# Patient Record
Sex: Female | Born: 1970 | Race: White | Hispanic: No | Marital: Married | Smoking: Current every day smoker
Health system: Southern US, Community
[De-identification: ages and names within clinical notes are randomized; demographics above are authoritative.]

## PROBLEM LIST (undated history)

## (undated) DIAGNOSIS — N809 Endometriosis, unspecified: Secondary | ICD-10-CM

## (undated) DIAGNOSIS — K7460 Unspecified cirrhosis of liver: Secondary | ICD-10-CM

## (undated) DIAGNOSIS — M1990 Unspecified osteoarthritis, unspecified site: Secondary | ICD-10-CM

## (undated) DIAGNOSIS — R52 Pain, unspecified: Secondary | ICD-10-CM

## (undated) DIAGNOSIS — O1490 Unspecified pre-eclampsia, unspecified trimester: Secondary | ICD-10-CM

## (undated) HISTORY — PX: TONSILLECTOMY: SUR1361

## (undated) HISTORY — PX: TUBAL LIGATION: SHX77

## (undated) HISTORY — PX: CHOLECYSTECTOMY: SHX55

---

## 2000-10-27 ENCOUNTER — Inpatient Hospital Stay (HOSPITAL_COMMUNITY): Admission: AD | Admit: 2000-10-27 | Discharge: 2000-10-27 | Payer: Self-pay | Admitting: *Deleted

## 2000-10-28 ENCOUNTER — Encounter: Payer: Self-pay | Admitting: *Deleted

## 2001-08-24 ENCOUNTER — Encounter: Payer: Self-pay | Admitting: Emergency Medicine

## 2001-08-24 ENCOUNTER — Emergency Department (HOSPITAL_COMMUNITY): Admission: EM | Admit: 2001-08-24 | Discharge: 2001-08-24 | Payer: Self-pay | Admitting: Emergency Medicine

## 2002-04-05 ENCOUNTER — Inpatient Hospital Stay (HOSPITAL_COMMUNITY): Admission: AD | Admit: 2002-04-05 | Discharge: 2002-04-05 | Payer: Self-pay | Admitting: *Deleted

## 2002-04-16 ENCOUNTER — Other Ambulatory Visit: Admission: RE | Admit: 2002-04-16 | Discharge: 2002-04-16 | Payer: Self-pay | Admitting: *Deleted

## 2002-06-02 ENCOUNTER — Inpatient Hospital Stay (HOSPITAL_COMMUNITY): Admission: AD | Admit: 2002-06-02 | Discharge: 2002-06-02 | Payer: Self-pay | Admitting: *Deleted

## 2002-09-18 ENCOUNTER — Inpatient Hospital Stay (HOSPITAL_COMMUNITY): Admission: AD | Admit: 2002-09-18 | Discharge: 2002-09-18 | Payer: Self-pay | Admitting: *Deleted

## 2002-10-21 ENCOUNTER — Inpatient Hospital Stay (HOSPITAL_COMMUNITY): Admission: AD | Admit: 2002-10-21 | Discharge: 2002-10-23 | Payer: Self-pay | Admitting: *Deleted

## 2002-10-25 ENCOUNTER — Inpatient Hospital Stay (HOSPITAL_COMMUNITY): Admission: AD | Admit: 2002-10-25 | Discharge: 2002-10-25 | Payer: Self-pay | Admitting: *Deleted

## 2002-12-07 ENCOUNTER — Inpatient Hospital Stay (HOSPITAL_COMMUNITY): Admission: AD | Admit: 2002-12-07 | Discharge: 2002-12-07 | Payer: Self-pay | Admitting: *Deleted

## 2003-04-27 ENCOUNTER — Emergency Department (HOSPITAL_COMMUNITY): Admission: EM | Admit: 2003-04-27 | Discharge: 2003-04-27 | Payer: Self-pay | Admitting: Emergency Medicine

## 2003-05-26 ENCOUNTER — Inpatient Hospital Stay (HOSPITAL_COMMUNITY): Admission: AD | Admit: 2003-05-26 | Discharge: 2003-05-27 | Payer: Self-pay | Admitting: *Deleted

## 2004-07-10 ENCOUNTER — Emergency Department (HOSPITAL_COMMUNITY): Admission: EM | Admit: 2004-07-10 | Discharge: 2004-07-10 | Payer: Self-pay | Admitting: Emergency Medicine

## 2006-08-25 ENCOUNTER — Emergency Department (HOSPITAL_COMMUNITY): Admission: EM | Admit: 2006-08-25 | Discharge: 2006-08-25 | Payer: Self-pay | Admitting: Emergency Medicine

## 2008-02-29 ENCOUNTER — Emergency Department (HOSPITAL_COMMUNITY): Admission: EM | Admit: 2008-02-29 | Discharge: 2008-02-29 | Payer: Self-pay | Admitting: Emergency Medicine

## 2008-09-09 ENCOUNTER — Inpatient Hospital Stay (HOSPITAL_COMMUNITY): Admission: AD | Admit: 2008-09-09 | Discharge: 2008-09-15 | Payer: Self-pay | Admitting: Obstetrics & Gynecology

## 2008-09-11 ENCOUNTER — Encounter (INDEPENDENT_AMBULATORY_CARE_PROVIDER_SITE_OTHER): Payer: Self-pay | Admitting: Obstetrics and Gynecology

## 2009-05-18 ENCOUNTER — Emergency Department (HOSPITAL_COMMUNITY): Admission: EM | Admit: 2009-05-18 | Discharge: 2009-05-18 | Payer: Self-pay | Admitting: Emergency Medicine

## 2009-05-21 ENCOUNTER — Encounter (HOSPITAL_COMMUNITY): Admission: RE | Admit: 2009-05-21 | Discharge: 2009-07-23 | Payer: Self-pay | Admitting: Emergency Medicine

## 2010-08-10 ENCOUNTER — Inpatient Hospital Stay (HOSPITAL_COMMUNITY): Admission: AD | Admit: 2010-08-10 | Discharge: 2010-08-10 | Payer: Self-pay | Admitting: Obstetrics & Gynecology

## 2010-08-10 ENCOUNTER — Ambulatory Visit: Payer: Self-pay | Admitting: Gynecology

## 2010-11-02 ENCOUNTER — Inpatient Hospital Stay (HOSPITAL_COMMUNITY)
Admission: AD | Admit: 2010-11-02 | Discharge: 2010-11-02 | Payer: Self-pay | Source: Home / Self Care | Attending: Obstetrics and Gynecology | Admitting: Obstetrics and Gynecology

## 2010-11-02 LAB — URINALYSIS, ROUTINE W REFLEX MICROSCOPIC
Bilirubin Urine: NEGATIVE
Hemoglobin, Urine: NEGATIVE
Ketones, ur: NEGATIVE mg/dL
Nitrite: NEGATIVE
Protein, ur: NEGATIVE mg/dL
Specific Gravity, Urine: <1.005 — ABNORMAL LOW (ref 1.005–1.030)
Urine Glucose, Fasting: NEGATIVE mg/dL
Urobilinogen, UA: 0.2 mg/dL (ref 0.0–1.0)
pH: 5.5 (ref 5.0–8.0)

## 2010-11-16 ENCOUNTER — Encounter
Admission: RE | Admit: 2010-11-16 | Discharge: 2010-11-29 | Payer: Self-pay | Source: Home / Self Care | Attending: Obstetrics and Gynecology | Admitting: Obstetrics and Gynecology

## 2011-01-02 ENCOUNTER — Ambulatory Visit: Payer: Self-pay | Admitting: Physical Therapy

## 2011-01-11 LAB — CBC
HCT: 43.7 % (ref 36.0–46.0)
Hemoglobin: 14.9 g/dL (ref 12.0–15.0)
MCH: 31.7 pg (ref 26.0–34.0)
MCHC: 34 g/dL (ref 30.0–36.0)
MCV: 93 fL (ref 78.0–100.0)
Platelets: 183 10*3/uL (ref 150–400)
RBC: 4.7 MIL/uL (ref 3.87–5.11)
RDW: 13.8 % (ref 11.5–15.5)
WBC: 10 10*3/uL (ref 4.0–10.5)

## 2011-01-11 LAB — URINE CULTURE
Colony Count: >=100000
Culture  Setup Time: 201110130039

## 2011-01-11 LAB — DIFFERENTIAL
Basophils Absolute: 0.1 10*3/uL (ref 0.0–0.1)
Basophils Relative: 1 % (ref 0–1)
Eosinophils Absolute: 0.2 10*3/uL (ref 0.0–0.7)
Eosinophils Relative: 2 % (ref 0–5)
Lymphocytes Relative: 22 % (ref 12–46)
Lymphs Abs: 2.2 10*3/uL (ref 0.7–4.0)
Monocytes Absolute: 0.4 10*3/uL (ref 0.1–1.0)
Monocytes Relative: 4 % (ref 3–12)
Neutro Abs: 7.2 10*3/uL (ref 1.7–7.7)
Neutrophils Relative %: 72 % (ref 43–77)

## 2011-01-11 LAB — URINALYSIS, ROUTINE W REFLEX MICROSCOPIC
Bilirubin Urine: NEGATIVE
Glucose, UA: NEGATIVE mg/dL
Ketones, ur: NEGATIVE mg/dL
Leukocytes, UA: NEGATIVE
Nitrite: POSITIVE — AB
Protein, ur: NEGATIVE mg/dL
Specific Gravity, Urine: <1.005 — ABNORMAL LOW (ref 1.005–1.030)
Urobilinogen, UA: 0.2 mg/dL (ref 0.0–1.0)
pH: 6 (ref 5.0–8.0)

## 2011-01-11 LAB — URINE MICROSCOPIC-ADD ON

## 2011-01-11 LAB — WET PREP, GENITAL
Trich, Wet Prep: NONE SEEN
Yeast Wet Prep HPF POC: NONE SEEN

## 2011-01-11 LAB — HCG, QUANTITATIVE, PREGNANCY: hCG, Beta Chain, Quant, S: 65282 m[IU]/mL — ABNORMAL HIGH (ref <5)

## 2011-01-11 LAB — POCT PREGNANCY, URINE: Preg Test, Ur: POSITIVE

## 2011-01-11 LAB — GC/CHLAMYDIA PROBE AMP, GENITAL
Chlamydia, DNA Probe: NEGATIVE
GC Probe Amp, Genital: NEGATIVE

## 2011-01-30 ENCOUNTER — Emergency Department (HOSPITAL_BASED_OUTPATIENT_CLINIC_OR_DEPARTMENT_OTHER)
Admission: EM | Admit: 2011-01-30 | Discharge: 2011-01-30 | Disposition: A | Discharge status: Home / Self Care | Payer: Medicaid Other | Attending: Emergency Medicine | Admitting: Emergency Medicine

## 2011-01-30 DIAGNOSIS — I10 Essential (primary) hypertension: Secondary | ICD-10-CM | POA: Insufficient documentation

## 2011-01-30 DIAGNOSIS — M549 Dorsalgia, unspecified: Secondary | ICD-10-CM | POA: Insufficient documentation

## 2011-03-02 ENCOUNTER — Inpatient Hospital Stay (HOSPITAL_COMMUNITY)
Admission: AD | Admit: 2011-03-02 | Discharge: 2011-03-02 | Disposition: A | Discharge status: Home / Self Care | Payer: Medicaid Other | Source: Ambulatory Visit | Attending: Obstetrics & Gynecology | Admitting: Obstetrics & Gynecology

## 2011-03-02 DIAGNOSIS — R109 Unspecified abdominal pain: Secondary | ICD-10-CM

## 2011-03-02 DIAGNOSIS — O4700 False labor before 37 completed weeks of gestation, unspecified trimester: Secondary | ICD-10-CM | POA: Insufficient documentation

## 2011-03-02 LAB — URINALYSIS, ROUTINE W REFLEX MICROSCOPIC
Bilirubin Urine: NEGATIVE
Glucose, UA: NEGATIVE mg/dL
Ketones, ur: NEGATIVE mg/dL
Leukocytes, UA: NEGATIVE
Nitrite: NEGATIVE
Protein, ur: NEGATIVE mg/dL
Specific Gravity, Urine: 1.01 (ref 1.005–1.030)
Urobilinogen, UA: 0.2 mg/dL (ref 0.0–1.0)
pH: 6.5 (ref 5.0–8.0)

## 2011-03-02 LAB — URINE MICROSCOPIC-ADD ON

## 2011-03-02 IMAGING — US US OB COMP LESS 14 WK
1 series · 14 of 23 positions shown · non-contrast
Comparison: None.

CLINICAL DATA: First trimester pregnancy.  Right flank pain.  Beta
HCG level [DATE].

OBSTETRIC <14 WK ULTRASOUND
TECHNIQUE: Transabdominal ultrasound was performed for evaluation
of the gestation as well as the maternal uterus and adnexal
regions.

[Series 1: us ob comp less 14 wks · 0.19mm/px · 23 acquisitions, 14 frames shown]
[im 1/23]
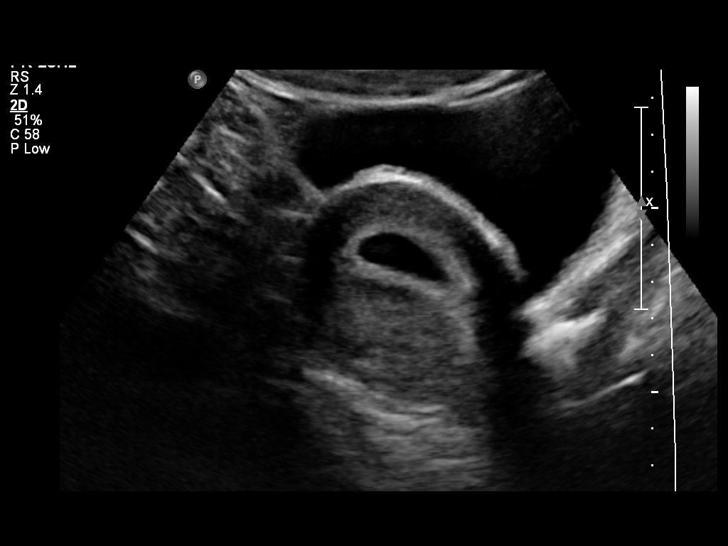
[im 3/23]
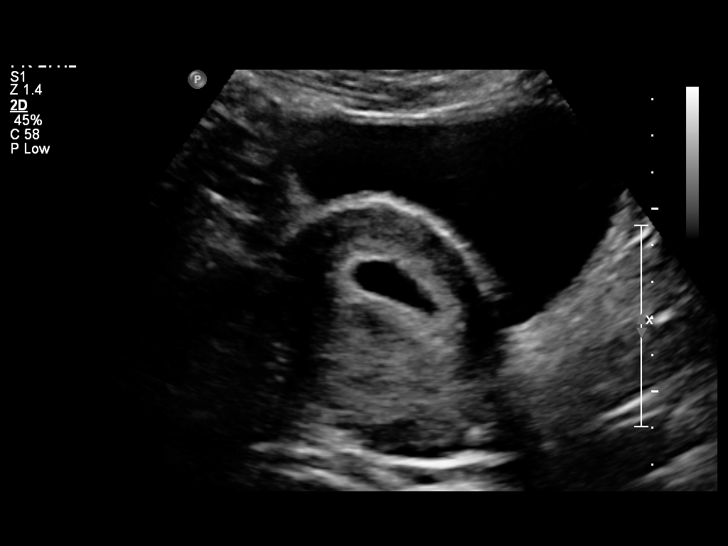
[im 5/23]
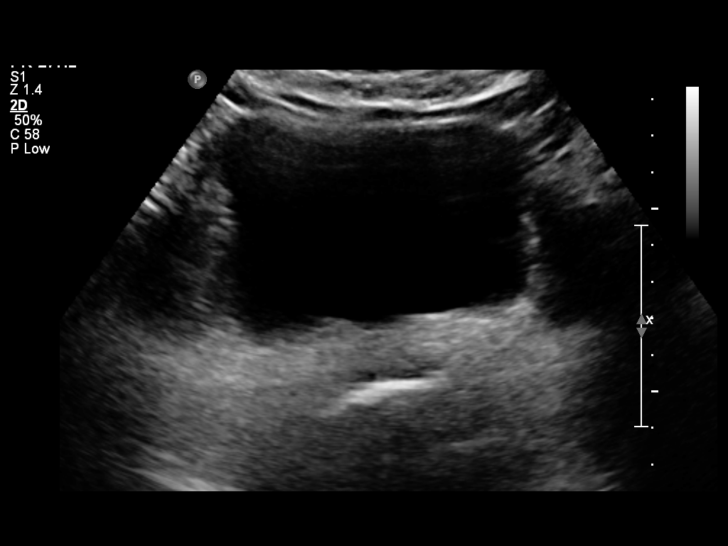
[im 6/23]
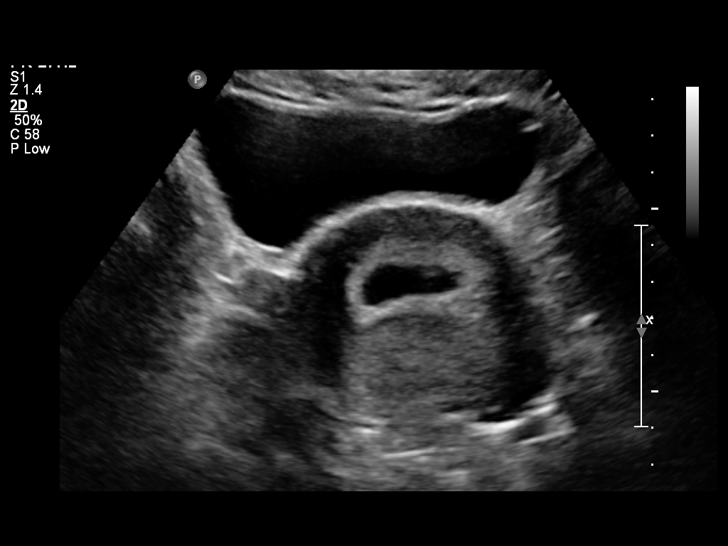
[im 8/23]
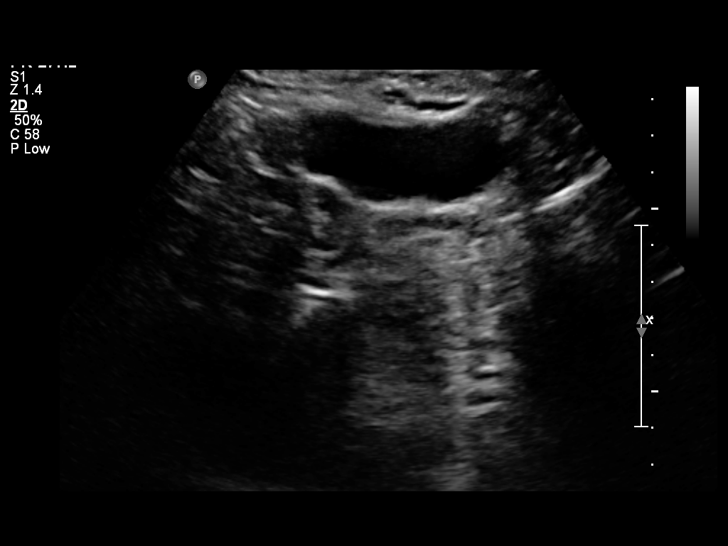
[im 10/23]
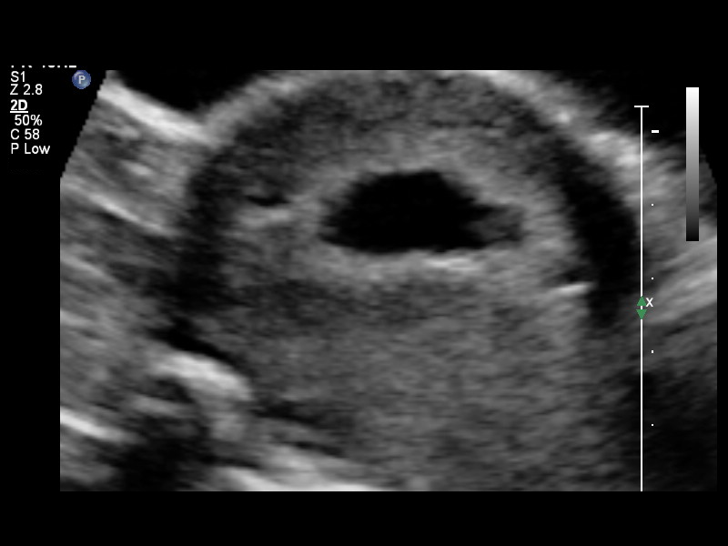
[im 11/23]
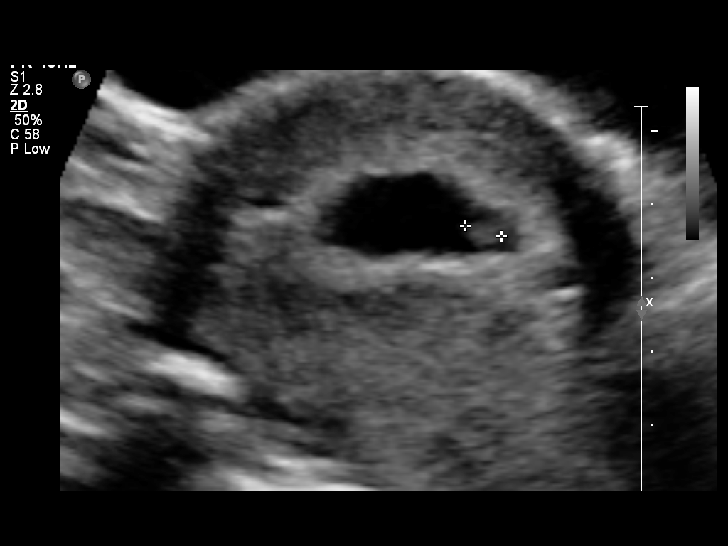
[im 13/23]
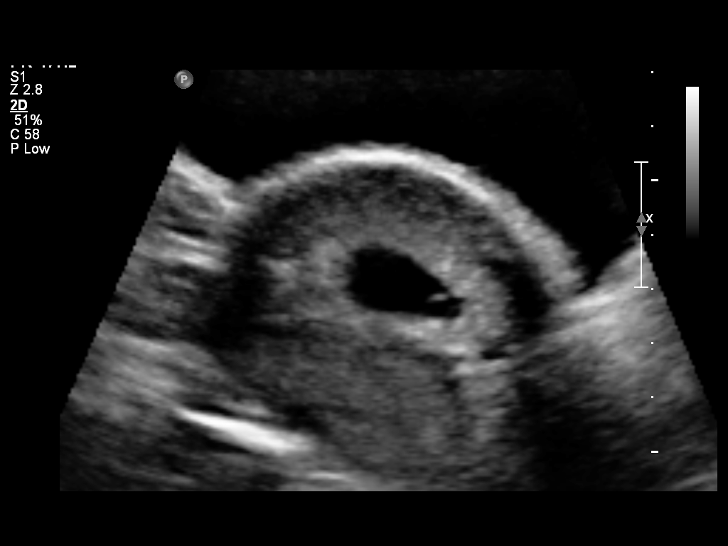
[im 14/23]
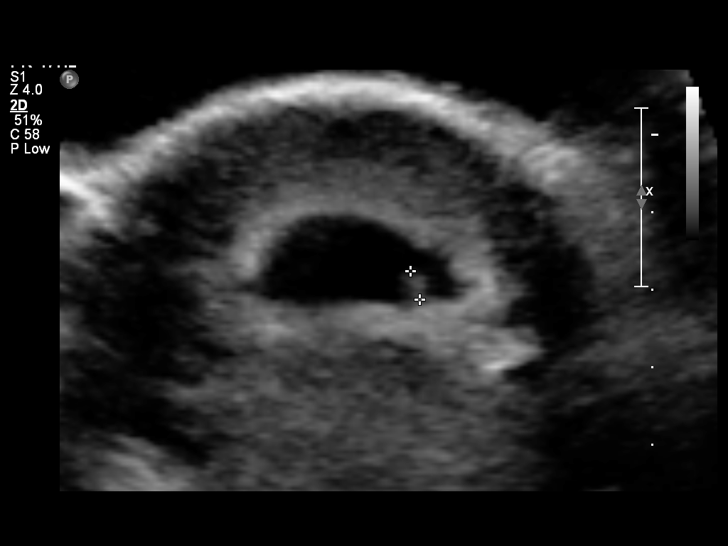
[im 16/23]
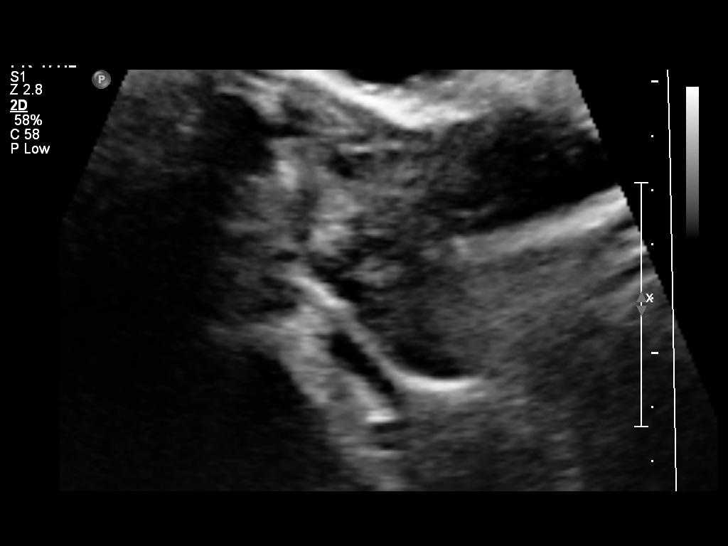
[im 18/23]
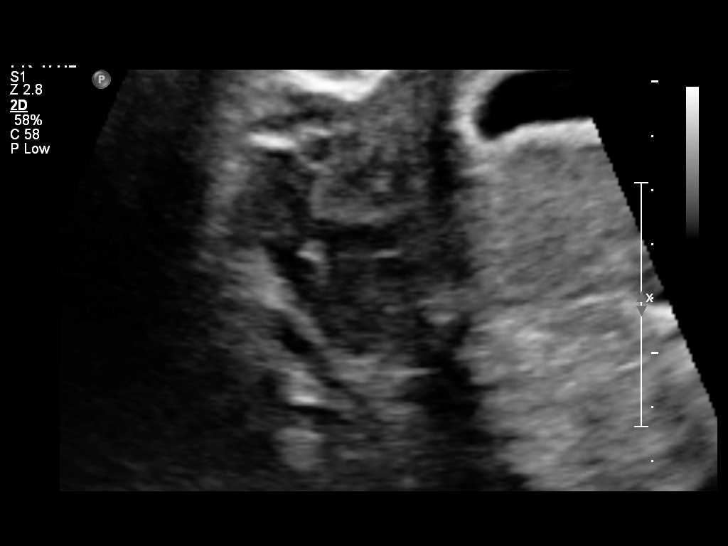
[im 19/23]
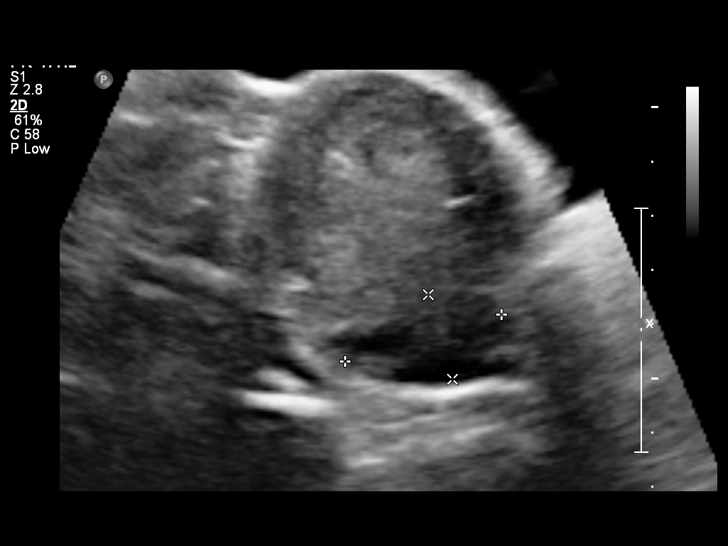
[im 21/23]
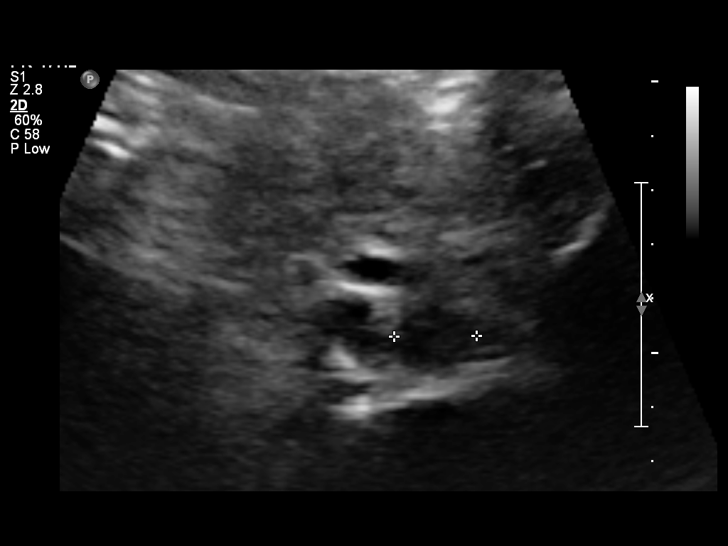
[im 23/23]
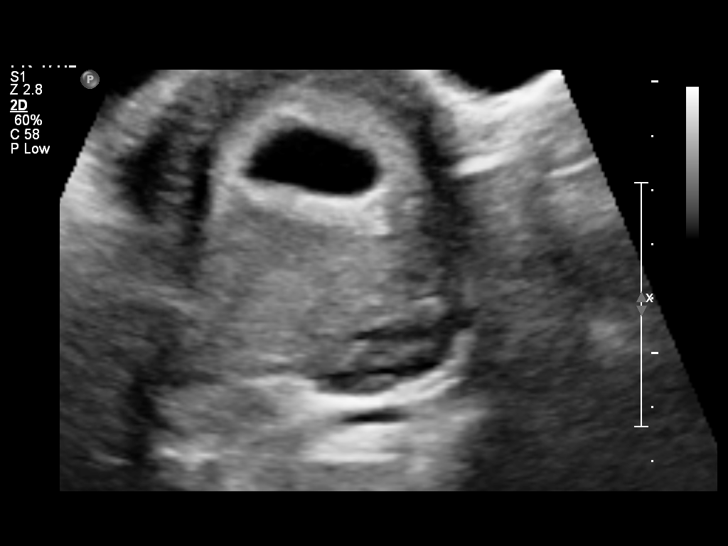

[14 of 23 positions shown; findings below may reference images not displayed]

Intrauterine gestational sac: Visualized and normal in shape.
Yolk sac: Visualized
Embryo: Visualized
Cardiac Activity: Visualized
Heart Rate: 132 bpm

CRL:  4.4 mm  6 weeks 1 day         US EDC: 04/04/2011

Maternal uterus/Adnexae:
No subchorionic hematoma is identified.  The maternal ovaries
appear normal.  There is no adnexal mass or free pelvic fluid.
IMPRESSION: 1.  Single live intrauterine gestation with best estimated
gestational age of 6 weeks 1 day.
2.  No demonstrated abnormalities.

## 2011-03-03 ENCOUNTER — Other Ambulatory Visit: Payer: Self-pay | Admitting: Obstetrics and Gynecology

## 2011-03-03 ENCOUNTER — Inpatient Hospital Stay (HOSPITAL_COMMUNITY)
Admission: AD | Admit: 2011-03-03 | Discharge: 2011-03-05 | DRG: 765 | Disposition: A | Discharge status: Home / Self Care | Payer: Medicaid Other | Source: Ambulatory Visit | Attending: Obstetrics and Gynecology | Admitting: Obstetrics and Gynecology

## 2011-03-03 DIAGNOSIS — O09529 Supervision of elderly multigravida, unspecified trimester: Secondary | ICD-10-CM | POA: Diagnosis present

## 2011-03-03 DIAGNOSIS — O34219 Maternal care for unspecified type scar from previous cesarean delivery: Secondary | ICD-10-CM | POA: Diagnosis present

## 2011-03-03 DIAGNOSIS — O4590 Premature separation of placenta, unspecified, unspecified trimester: Principal | ICD-10-CM | POA: Diagnosis present

## 2011-03-03 DIAGNOSIS — Z302 Encounter for sterilization: Secondary | ICD-10-CM

## 2011-03-03 LAB — DIFFERENTIAL
Basophils Absolute: 0 10*3/uL (ref 0.0–0.1)
Basophils Relative: 0 % (ref 0–1)
Eosinophils Absolute: 0.1 10*3/uL (ref 0.0–0.7)
Eosinophils Relative: 1 % (ref 0–5)
Lymphocytes Relative: 11 % — ABNORMAL LOW (ref 12–46)
Lymphs Abs: 2 10*3/uL (ref 0.7–4.0)
Monocytes Absolute: 0.7 10*3/uL (ref 0.1–1.0)
Monocytes Relative: 4 % (ref 3–12)
Neutro Abs: 14.9 10*3/uL — ABNORMAL HIGH (ref 1.7–7.7)
Neutrophils Relative %: 84 % — ABNORMAL HIGH (ref 43–77)

## 2011-03-03 LAB — TYPE AND SCREEN
ABO/RH(D): O POS
Antibody Screen: NEGATIVE

## 2011-03-03 LAB — CBC
HCT: 40.9 % (ref 36.0–46.0)
Hemoglobin: 14.1 g/dL (ref 12.0–15.0)
MCH: 30.9 pg (ref 26.0–34.0)
MCHC: 34.5 g/dL (ref 30.0–36.0)
MCV: 89.5 fL (ref 78.0–100.0)
Platelets: 156 10*3/uL (ref 150–400)
RBC: 4.57 MIL/uL (ref 3.87–5.11)
RDW: 14.3 % (ref 11.5–15.5)
WBC: 17.8 10*3/uL — ABNORMAL HIGH (ref 4.0–10.5)

## 2011-03-03 LAB — ABO/RH: ABO/RH(D): O POS

## 2011-03-04 LAB — CBC
HCT: 36.3 % (ref 36.0–46.0)
Hemoglobin: 12.2 g/dL (ref 12.0–15.0)
MCH: 30.3 pg (ref 26.0–34.0)
MCHC: 33.6 g/dL (ref 30.0–36.0)
MCV: 90.3 fL (ref 78.0–100.0)
Platelets: 122 10*3/uL — ABNORMAL LOW (ref 150–400)
RBC: 4.02 MIL/uL (ref 3.87–5.11)
RDW: 14.2 % (ref 11.5–15.5)
WBC: 14.5 10*3/uL — ABNORMAL HIGH (ref 4.0–10.5)

## 2011-03-04 LAB — RPR: RPR Ser Ql: NONREACTIVE

## 2011-03-14 NOTE — Op Note (Signed)
NAMESHUNDRA, WIRSING NO.:  [id number]   MEDICAL RECORD NO.:  [id number]          PATIENT TYPE:  INP   LOCATION:  9373                          FACILITY:  [facility]   PHYSICIAN:  Carrington Clamp, M.D. DATE OF BIRTH:  May 04, 1971   DATE OF PROCEDURE:  09/11/2008  DATE OF DISCHARGE:                               OPERATIVE REPORT   PREOPERATIVE DIAGNOSES:  1. Severe preeclampsia at 34 and 6/7th weeks.  2. Renal insufficiency.  3. Induction of labor secondary to above problems.  4. Nonreassuring fetal heart tones.  5. Severe variable D cells.   POSTOPERATIVE DIAGNOSIS:  1. Severe preeclampsia at 34 and 6/7th weeks.  2. Renal insufficiency.  3. Induction of labor secondary to above problems.  4. Nonreassuring fetal heart tones.  5. Severe variable D cells.   PROCEDURES:  Primary low-transverse cesarean section.   SURGEON:  Carrington Clamp, MD.   ASSISTANT:  None.   ANESTHESIA:  Epidural.   FINDINGS:  Female infant vertex presentation, Apgars 8 and 9.  Cord pH  was 7.29.  Weight was 4 pounds 15 ounces.  Normal tubes, ovaries, and  uterus was seen.   SPECIMENS:  Placenta.   DISPOSITION:  To Pathology.   ESTIMATED BLOOD LOSS:  500 mL.   IV FLUIDS:  600 mL.   URINE OUTPUT:  200 mL.   COMPLICATIONS:  None.   MEDICATIONS:  Clinda and Pitocin.   COUNTS:  Correct x3.   REASON FOR OPERATION:  Ms. Kaney was found this morning to have renal  insufficiency with a creatinine of 1.28, declining platelets from 150-  120, and blood pressures that were extremely labile at 160s over 90s-  100s.  Decision was made for induction of labor.  The patient was moved  to labor and delivery and Pitocin was started.  Membranes were ruptured  at about 2 o'clock.  Clear fluid was noted and the hand was noted to be  close to the cervix, so it was a true compound presentation.  Since then  the [facility] has remained stable.  She was rechecked in 2 hours and the head  was found at  that time to be well applied without a compound  presentation.  After a fetal scalpel, electrode was placed; however, the  patient had 2 severe variable D cells to the 40s for about a minute.  Each time the heart rate returned to baseline and after the second one  the heart rate was actually reactive without D cells.  The patient was  continued to labor and the Pitocin restarted.  At 5:24, I was called to  see the patient for second set of D cells now down to the 30s.  The  second D cell did not seem to be returning to baseline very quickly and  the labor and delivery team was instructed to move the patient to the  OR.  I met the patient and the staff in the OR.  The labor and delivery  nurse had indicated that she had checked for a prolapse cord and  compound hand and found neither.  The fetal  heart tones at this time  were returned to heart rate of 112 and after discussing with the patient  the risks, benefits, and alternatives, we went ahead and proceeded with  a C-section under spinal anesthesia.   TECHNIQUE:  After adequate spinal anesthesia was achieved, the patient  was prepped, draped in a sterile fashion in a dorsal supine position  with leftward tilt.  A Pfannenstiel skin incision was made with the  scalpel and carried down to fascia with the Bovie cautery.  The fascia  was incised in the midline and carried in transverse curvilinear manner  with the Mayo scissors.  The rectus muscles were split in the midline in  the bowel free portion.  The peritoneum was entered bluntly.  Peritoneum  was incised in superior and inferior manner with good visualization of  bowel and bladder.  The Alexis instrument was then placed and  vesicouterine fascia tented up and incised in a transverse curvilinear  manner.   The bladder flap was created with blunt dissection and retracted out of  the way and a 2-cm incision was made in the upper portion of lower  uterine segment until the amnion was  entered into.  The uterine incision  was stretched open manually in a transverse curvilinear manner and the  baby was identified in the vertex presentation delivered without  complication.  Baby was bulb suctioned, the cord was clamped and cut,  and baby was handed to awaiting neonatologist.  The cord bloods and  gases were obtained and the placenta was then delivered manually.  The  uterus was cleared of all debris and the uterine incision then closed  with a running lock stitch of 0-Monocryl.  An imbricating layer of 0-  Monocryl was performed as well.  Once hemostasis was achieved after  irrigation had been performed, the [employer] and all other  instruments were removed from the abdomen and the peritoneum was closed  with a running stitch of 2-0 Vicryl.  This was corporated to the rectus  muscles as a separate layer.  The fascia was closed with running stitch  of 0-Vicryl.  The subcutaneous tissue was rendered hemostatic with Bovie  cautery and irrigation.  This layer was then closed with interrupted  stitches of 2-0 plain gut.  The skin was closed with staples.  The baby  was vigorous and I believe was transferred to the newborn nursery and  weighed and found to be more than 2 kg.  The patient was transferred to  the recovery room in stable condition.  The patient had had a 24-hour  urine done earlier that day that had 104 mg of protein in it.      Carrington Clamp, M.D.  Electronically Signed     MH/MEDQ  D:  09/11/2008  T:  09/12/2008  Job:  [id number]

## 2011-03-15 NOTE — Op Note (Signed)
[name], Allen NO.:  [id number]  MEDICAL RECORD NO.:  [id number]           PATIENT TYPE:  I  LOCATION:  9143                          [facility]ACILITY:  [facility]  PHYSICIAN:  Carrington Clamp, M.D. DATE OF BIRTH:  Jan 10, 1971  DATE OF PROCEDURE:  03/03/2011 DATE OF DISCHARGE:                              OPERATIVE REPORT   PREOPERATIVE DIAGNOSES:  Probable abruption at 35 weeks in labor, previous cesarean section, and multiparous; desires permanent sterility.  POSTOPERATIVE DIAGNOSES:  Abruption at 35 weeks, previous cesarean section, and multiparous; desires permanent sterility.  PROCEDURE:  Low-transverse cesarean section with bilateral tubal ligation.  SURGEON:  Carrington Clamp, MD  ASSISTANTS:  None.  ANESTHESIA:  Spinal.  FINDINGS:  Female infant, vertex presentation with a nuchal cord. Apgars were 8 and 9.  Weight was 5 pounds 14 ounces.  There were normal tubes and ovaries seen.  There was a Couvelaire uterus (bruising on the fundus and right cornu of the uterus indicating an abruption).  SPECIMENS:  Right and left tubal segments and placenta to pathology.  ESTIMATED BLOOD LOSS:  600 mL.  IV FLUIDS:  1800 mL.  URINE OUTPUT:  200 mL.  COMPLICATIONS:  None.  COUNTS:  Correct x3.  REASON FOR OPERATION:  Ms. Dorothy Allen called me at 6 o'clock this evening stating that she had a large gush of blood going down her leg and was on the way to the hospital.  I agreed with that plan.  When she arrived at the hospital, the nurses observed yet again another gush of blood, although the fetal heart tones were reactive at that time.  I came in to see the patient after the nurse practitioner had done a sterile speculum exam for me revealing a moderate amount of bright red blood in the vault.  When I arrived, the patient's fetal heart tones were stable and reactive without decels, however, digital exam revealed cervix that was 1-250 and -2.  There was  bright red blood on the glove when it was withdrawn.  The patient was also contracting every 1-2 minutes and was in a lot of discomfort.  Previously, her cervix had been closed.  I discussed with the patient the risks, benefits, and alternatives of repeat C-section.  The patient had recently booked a repeat C-section and desired a repeat C-section in the event of labor. I discussed with the patient that I was afraid that there might either be an abruption or a uterine rupture because of the bleeding.  The patient agreed to proceed with a C-section at 35 weeks.  I queried the patient's several times about whether or not she was sure she wanted to have her tubes tied, she stated yes every time.  She has also indicated that she had signed the Medicaid papers on February 21, 2011.  She qualifies for emergency sterilization during the emergency preterm procedure.  TECHNIQUE:  After adequate spinal anesthesia was achieved, the patient was prepped and draped in usual sterile fashion in dorsal supine position with leftward tilt.  A Pfannenstiel skin incision was made with a scalpel and carried down to the fascia with  a Bovie cautery.  The fascia was incised in the midline and carried in transverse curvilinear manner with the Mayo scissors.  The fascia was reflected superiorly and inferiorly from the rectus muscles.  The rectus muscles split in the midline.  A bowel free portion of the peritoneum was then entered into with sharp and blunt dissection.  The peritoneum was then extended in superoinferior manner with good visualization of the bowel and the bladder.  The Alexis instrument was then placed inside the abdomen and set.  The vesicouterine fascia was tented up and incised in a transverse curvilinear manner with the Metzenbaum scissors.  The bladder flap was retracted out of the way and 2-cm incision was made and a 2-cm incision was made transversely in a very thin part of the upper portion  of the lower uterine segment.  Clear fluid was noted on entry into the uterus and amnion and the uterine incision was extended in transverse curvilinear manner.  The baby was identified in vertex presentation, delivered after double nuchal cord had been reduced.  The baby was handed to awaiting Pediatrics after cord had been clamped and cut.  Cord bloods had been obtained.  The placenta then delivered very easily through the incision without manual assistance.  The uterus was then cleared of all debris.  The uterine incision was closed with a running locked stitch of 0 Monocryl.  An imbricating layer of 0 Monocryl was performed as well.  Hemostasis was achieved.  The tubes were then brought up through the incision and each isthmic portion of mesosalpinx was entered into avascular portion with the Bovie cautery.  Two freehand stitches were placed on either side of the tube to be able to create an intervening segment of approximately 3 cm.  Each segment was then excised with Metzenbaum scissors.  Once hemostasis was achieved, the uterus was brought forward just a little bit and there was some bruising and bluish discoloration of the top of the uterus and some thrombosis of the vessels from the cornua and the right tube into the uterus which I believe is very consistent with an abruption.  The uterus was then replaced and irrigation was performed and the uterine incision reinspected and found to be hemostatic.  All instruments were then withdrawn from the abdomen.  The peritoneum was closed with a running stitch of 2-0 Vicryl.  This incorporated the rectus muscles as a separate layer.  The fascia was then closed with running stitch of 0 Vicryl.  The subcutaneous tissue was rendered hemostatic with Bovie cautery and irrigation.  This was then closed with interrupted stitches of plain gut.  The skin was closed with staples.  The patient tolerated the procedure well and was turned to recovery  room in stable condition.     Carrington Clamp, M.D.     MH/MEDQ  D:  03/03/2011  T:  03/04/2011  Job:  [id number]  Electronically Signed by Carrington Clamp MD on 03/15/2011 03:01:26 PM

## 2011-03-15 NOTE — Discharge Summary (Signed)
  Dorothy Allen, Dorothy Allen              ACCOUNT NO.:  [id number]  MEDICAL RECORD NO.:  [id number]           PATIENT TYPE:  I  LOCATION:  9143                          [facility]ACILITY[facility]  PHYSICIAN:  Carrington Clamp, M.D. DATE OF BIRTH:  1970/12/02  DATE OF ADMISSION:  03/03/2011 DATE OF DISCHARGE:  03/05/2011                              DISCHARGE SUMMARY   FINAL DIAGNOSES:  Intrauterine pregnancy at 24 weeks' gestation, abruption, history of previous cesarean section.  The patient desires permanent sterilization.  PROCEDURE:  Repeat low transverse cesarean section with bilateral tubal ligation.  SURGEON:  Dr. Carrington Clamp.  COMPLICATIONS:  None.  This 40 year old G4, P 2-0-1-2 presents at 81 weeks' gestation complaining of heavy bleeding with clots.  Dr. Henderson Cloud was called at 6 p.m.  Bleeding was moderate. She presented at the [facility].  She was having contractions and pain at this point.  The patient had a history of a cesarean section at 34 weeks and otherwise had an uncomplicated antepartum course.  Upon admission, fetal heart tones were in the 140s and reactive.  The patient was about 2 cm dilated and -2 station.  Because of this probable abruption at 35 weeks and history of cesarean section and also desires a permanent sterilization, decision was made to proceed with a cesarean section. The patient was taken to the operating room on Mar 04, 2011, by Dr. Carrington Clamp where a repeat low transverse cesarean section was performed with the delivery of a 5-pound 14-ounce female infant with Apgars of 8 and 9.  Delivery went without complications.  There was some bruising noticed on the fundus indicating an abruption.  Delivery went without complications.  The patient still expressed desire for permanent sterilization which was performed.  The patient's postoperative course was benign without any significant fevers.  The patient was felt ready for discharge on  postoperative day #2.  She was sent home on a regular diet, told to decrease activities, told to continue her prenatal vitamins, was given a prescription for Percocet 1-2 every 4-6 hours as needed for pain, was to follow up in our office in 4 weeks. Instructions and precautions were reviewed with the patient.  LABS ON DISCHARGE:  The patient had a hemoglobin of 12.2, white blood cell count of 14.5 and platelets of 122,000.    Leilani Able, P.A.-C.   ______________________________ Carrington Clamp, M.D.   MB/MEDQ  D:  03/07/2011  T:  03/07/2011  Job:  [id number]  Electronically Signed by Leilani Able P.A.-C. on 03/13/2011 08:50:38 AM Electronically Signed by Carrington Clamp MD on 03/15/2011 03:01:31 PM

## 2011-03-17 NOTE — Op Note (Signed)
   NAME:  Dorothy Allen, Dorothy Allen                         ACCOUNT NO.:  [id number]   MEDICAL RECORD NO.:  [id number]                   PATIENT TYPE:  INP   LOCATION:  9132                                 [facility]   PHYSICIAN:  Georgina Peer, M.D.              DATE OF BIRTH:  [birth year]   DATE OF PROCEDURE:  10/21/2002  DATE OF DISCHARGE:                                 OPERATIVE REPORT   INDICATIONS FOR OPERATIVE DELIVERY:  Maternal fatigue after pushing for two  hours.  Post delivery explanation was direct occiput posterior presentation.  Delivery by KIWI vacuum extraction.  First degree perineal laceration at  1731.   INDICATIONS:  A 40 year old gravida 2, para 0-0-1-0, [facility] November 01, 2001  presented in labor.  Had amniotomy at 7 a.m.  Progressed to complete  dilatation with augmentation at 1530.  Pushed well and at 1730 became  fatigued and had not had descent of the head past +2/+3 station.  There was  caput and molding, but apparently adequate pelvis.  Vacuum extraction was  offered including risks of bleeding, infection, cephalohematoma, scalp  lacerations.  These were accepted.  The KIWI vacuum device was applied and  within two contractions with good pushing effort and no pop-offs, a viable  female was delivered at 1731 direct occiput posterior.  There was a first  degree left mediolateral laceration which was repaired.  The placenta  spontaneously delivered at 1739.  Was repaired with chromic over 1%  Xylocaine anesthesia.  The infant had Apgars of 8 and 9.  The mother was  breast-feeding.  The infant went to the regular nursery.  The infant's name  was Dorothy Allen.  Anesthesia for delivery was epidural plus local 1% Xylocaine  for the perineal repair.                                               Georgina Peer, M.D.    JPN/MEDQ  D:  10/21/2002  T:  10/21/2002  Job:  [id number]

## 2011-03-17 NOTE — Discharge Summary (Signed)
NAMECHERAY, PARDI NO.:  [id number]   MEDICAL RECORD NO.:  [id number]          PATIENT TYPE:  INP   LOCATION:  9132                          [facility]   PHYSICIAN:  Carrington Clamp, M.D. DATE OF BIRTH:  Jun 25, 1971   DATE OF ADMISSION:  09/09/2008  DATE OF DISCHARGE:  09/15/2008                               DISCHARGE SUMMARY   FINAL DIAGNOSES:  1. Intrauterine pregnancy at 34-6/7th weeks' gestation.  2. Severe preeclampsia.  3. Renal insufficiency.  4. Induction of labor.  5. Nonreassuring fetal heart tone.  6. Severe variable decelerations.   PROCEDURE:  Primary low transverse cesarean section.   SURGEON:  Carrington Clamp, MD   COMPLICATIONS:  None.   This 40 year old, G3, P1-0-1-1, presents at 50-35 weeks' gestation with  elevated blood pressures.  She was seen in the office, sent to maternity  admission.  She had a BPP which was 6/8 and an estimated fetal weight in  about the 50th percentile and normal amniotic fluid index.  The  patient's antepartum course up to this point had been complicated by  tobacco dependence, which she was counseled on cessation during her  pregnancy.  The patient also had late prenatal care and was known to be  positive for group B strep and  was on extra iron supplement for her  anemia.  The patient was also known to be sensitive to latex.  The  patient's blood pressure did not start to elevate until this time.  Otherwise, she did not have any problems with this during her pregnancy.  She was admitted to the hospital.  Her liver function test at first  looked normal.  She did was showing signs of renal insufficiency with  low platelets and a decision was made to proceed with delivery at this  time.  The patient was induced, AROM was performed and IUPCs were  placed.  There was some nonreassuring fetal heart tones with some  decelerations down to the 30s for 2 minutes.  The patient was admitted  directly to the OR.   Fetal heart tones were turned back up to about 112.  The patient was taken for immediate cesarean section on September 11, 2008, by Dr. Carrington Clamp.  The patient had the delivery of a 4-  pound and 15-ounce female infant with Apgars of 8 and 9.  There was a  cord pH of 7.29.  Delivery went without complications.   POSTOPERATIVE COURSE:  The patient was continued to be on mag sulfate  and watched closely.  The patient was kept on mag sulfate until September 14, 2008.  She was stabilizing.  The patient did have a renal ultrasound  prior to this, which was normal.  By postoperative day #4, the patient  was felt ready for discharge.  She was sent home.  Her BUN and  creatinine had normalized.  She had been off mag sulfate.  Her blood  pressures were stable.  She was sent home with Percocet 1-2 every 4-6  hours as needed for pain, was given labetalol 200 mg to be taking twice  daily, her Protonix and her iron supplement, was to follow up in our  office in 1 week to recheck her blood pressure and of course to call for  any headaches, increased swelling, pain, or problems.   LABS ON DISCHARGE:  The patient had hemoglobin of 9.5, white blood cell  count of 8.2, platelets of 174,000, which were up from a low of 120,000.  The patient also had a BUN of 6 and a creatinine of 0.99 on the September 15, 2008 and uric acid levels which were returning to normal.      Leilani Able, P.A.-C.      Carrington Clamp, M.D.  Electronically Signed    MB/MEDQ  D:  10/12/2008  T:  10/13/2008  Job:  [id number]

## 2011-03-29 ENCOUNTER — Inpatient Hospital Stay (HOSPITAL_COMMUNITY): Admission: RE | Admit: 2011-03-29 | Payer: Self-pay | Source: Ambulatory Visit | Admitting: Obstetrics and Gynecology

## 2011-04-05 ENCOUNTER — Inpatient Hospital Stay (HOSPITAL_COMMUNITY): Admission: AD | Admit: 2011-04-05 | Payer: Self-pay | Source: Home / Self Care | Admitting: Obstetrics and Gynecology

## 2011-08-01 LAB — COMPREHENSIVE METABOLIC PANEL
ALT: 18
ALT: 22
ALT: 23
AST: 23
AST: 25
AST: 31
Albumin: 2 — ABNORMAL LOW
Albumin: 2.1 — ABNORMAL LOW
Albumin: 2.3 — ABNORMAL LOW
Albumin: 2.7 — ABNORMAL LOW
Alkaline Phosphatase: 101
Alkaline Phosphatase: 112
Alkaline Phosphatase: 131 — ABNORMAL HIGH
Alkaline Phosphatase: 89
BUN: 3 — ABNORMAL LOW
BUN: 3 — ABNORMAL LOW
BUN: 3 — ABNORMAL LOW
CO2: 26
CO2: 28
Calcium: 8.1 — ABNORMAL LOW
Chloride: 101
Chloride: 105
Chloride: 106
Creatinine, Ser: 1.25 — ABNORMAL HIGH
Creatinine, Ser: 1.28 — ABNORMAL HIGH
Creatinine, Ser: 1.3 — ABNORMAL HIGH
Creatinine, Ser: 1.3 — ABNORMAL HIGH
GFR calc Af Amer: 53 — ABNORMAL LOW
GFR calc Af Amer: 56 — ABNORMAL LOW
GFR calc Af Amer: >60
GFR calc non Af Amer: 49 — ABNORMAL LOW
GFR calc non Af Amer: 54 — ABNORMAL LOW
Glucose, Bld: 109 — ABNORMAL HIGH
Glucose, Bld: 84
Glucose, Bld: 92
Potassium: 3.1 — ABNORMAL LOW
Potassium: 3.4 — ABNORMAL LOW
Potassium: 3.6
Potassium: 3.7
Sodium: 137
Sodium: 139
Sodium: 140
Total Bilirubin: 0.3
Total Bilirubin: 0.4
Total Bilirubin: 0.4
Total Protein: 4.9 — ABNORMAL LOW
Total Protein: 5 — ABNORMAL LOW
Total Protein: 5.2 — ABNORMAL LOW
Total Protein: 6.2

## 2011-08-01 LAB — DIFFERENTIAL
Basophils Absolute: 0
Basophils Relative: 0
Basophils Relative: 0
Eosinophils Absolute: 0.1
Eosinophils Relative: 1
Eosinophils Relative: 1
Eosinophils Relative: 1
Eosinophils Relative: 1
Lymphocytes Relative: 14
Lymphocytes Relative: 15
Lymphs Abs: 1.3
Monocytes Absolute: 0.4
Monocytes Absolute: 0.4
Monocytes Absolute: 0.4
Monocytes Relative: 4
Monocytes Relative: 5
Neutro Abs: 7.2
Neutro Abs: 9.3 — ABNORMAL HIGH
Neutrophils Relative %: 79 — ABNORMAL HIGH

## 2011-08-01 LAB — RPR: RPR Ser Ql: NONREACTIVE

## 2011-08-01 LAB — URINALYSIS, ROUTINE W REFLEX MICROSCOPIC
Bilirubin Urine: NEGATIVE
Hgb urine dipstick: NEGATIVE
Ketones, ur: NEGATIVE
Specific Gravity, Urine: <1.005 — ABNORMAL LOW
pH: 7

## 2011-08-01 LAB — URIC ACID
Uric Acid, Serum: 8.2 — ABNORMAL HIGH
Uric Acid, Serum: 8.8 — ABNORMAL HIGH

## 2011-08-01 LAB — CBC
HCT: 30.5 — ABNORMAL LOW
HCT: 31.8 — ABNORMAL LOW
HCT: 38.7
Hemoglobin: 10.9 — ABNORMAL LOW
Hemoglobin: 11.3 — ABNORMAL LOW
Hemoglobin: 12.9
Hemoglobin: 9.5 — ABNORMAL LOW
MCHC: 33.2
MCHC: 34.8
MCHC: 34.9
MCV: 93.5
MCV: 93.8
MCV: 93.8
MCV: 94
Platelets: 120 — ABNORMAL LOW
Platelets: 134 — ABNORMAL LOW
Platelets: 177
RBC: 3.34 — ABNORMAL LOW
RBC: 3.4 — ABNORMAL LOW
RBC: 3.59 — ABNORMAL LOW
RBC: 3.63 — ABNORMAL LOW
RDW: 13.6
RDW: 13.7
RDW: 13.9
RDW: 14
WBC: 10.9 — ABNORMAL HIGH
WBC: 11.3 — ABNORMAL HIGH
WBC: 11.3 — ABNORMAL HIGH
WBC: 12.3 — ABNORMAL HIGH

## 2011-08-01 LAB — MAGNESIUM
Magnesium: 3.5 — ABNORMAL HIGH
Magnesium: 4.2 — ABNORMAL HIGH
Magnesium: 4.2 — ABNORMAL HIGH
Magnesium: 4.4 — ABNORMAL HIGH
Magnesium: 5.3 — ABNORMAL HIGH

## 2011-08-01 LAB — LACTATE DEHYDROGENASE
LDH: 135
LDH: 160

## 2011-08-01 LAB — CREATININE, SERUM
Creatinine, Ser: 1.35 — ABNORMAL HIGH
Creatinine, Ser: 1.36 — ABNORMAL HIGH
GFR calc Af Amer: 54 — ABNORMAL LOW
GFR calc non Af Amer: 44 — ABNORMAL LOW
GFR calc non Af Amer: 45 — ABNORMAL LOW

## 2011-08-01 LAB — PROTEIN, URINE, 24 HOUR
Protein, 24H Urine: 104 — ABNORMAL HIGH
Urine Total Volume-UPROT: 2600

## 2011-08-01 LAB — CREATININE CLEARANCE, URINE, 24 HOUR
Creatinine, 24H Ur: 866
Creatinine: 1.28 — ABNORMAL HIGH

## 2011-08-01 LAB — BUN
BUN: 5 — ABNORMAL LOW
BUN: 6

## 2011-08-01 LAB — CULTURE, BETA STREP (GROUP B ONLY)

## 2011-08-01 LAB — SODIUM, URINE, RANDOM: Sodium, Ur: 38

## 2011-08-01 LAB — URINE MICROSCOPIC-ADD ON

## 2011-08-23 ENCOUNTER — Ambulatory Visit (INDEPENDENT_AMBULATORY_CARE_PROVIDER_SITE_OTHER): Payer: Medicaid Other | Admitting: Family Medicine

## 2011-08-23 ENCOUNTER — Encounter: Payer: Self-pay | Admitting: Family Medicine

## 2011-08-23 DIAGNOSIS — M79605 Pain in left leg: Secondary | ICD-10-CM | POA: Insufficient documentation

## 2011-08-23 DIAGNOSIS — K219 Gastro-esophageal reflux disease without esophagitis: Secondary | ICD-10-CM

## 2011-08-23 DIAGNOSIS — M79604 Pain in right leg: Secondary | ICD-10-CM

## 2011-08-23 DIAGNOSIS — M545 Low back pain: Secondary | ICD-10-CM

## 2011-08-23 MED ORDER — OXYCODONE-ACETAMINOPHEN 7.5-325 MG PO TABS: 1.0000 | ORAL_TABLET | Freq: Two times a day (BID) | ORAL | Status: DC | PRN

## 2011-08-23 MED ORDER — CYCLOBENZAPRINE HCL 10 MG PO TABS: 10.0000 mg | ORAL_TABLET | Freq: Three times a day (TID) | ORAL | Status: DC | PRN

## 2011-08-23 NOTE — Patient Instructions (Signed)
Keep your appointment with me on the 11/2 Don't forget to do the gentle stretching exercises we talked about.
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
## 2011-08-24 NOTE — Assessment & Plan Note (Signed)
See back pain Acute on chronic low back pain.

## 2011-09-01 ENCOUNTER — Encounter: Payer: Self-pay | Admitting: Family Medicine

## 2011-09-01 ENCOUNTER — Ambulatory Visit (INDEPENDENT_AMBULATORY_CARE_PROVIDER_SITE_OTHER): Payer: Medicaid Other | Admitting: Family Medicine

## 2011-09-01 ENCOUNTER — Ambulatory Visit: Payer: Medicaid Other | Admitting: Family Medicine

## 2011-09-01 DIAGNOSIS — M545 Low back pain: Secondary | ICD-10-CM

## 2011-09-01 MED ORDER — NAPROXEN 500 MG PO TABS: 500.0000 mg | ORAL_TABLET | Freq: Two times a day (BID) | ORAL | Status: DC

## 2011-09-01 NOTE — Patient Instructions (Signed)
Make follow-up with Dr. Leveda Anna  Lumbosacral Strain Lumbosacral strain is one of the most common causes of back pain. There are many causes of back pain. Most are not serious conditions. CAUSES   Your backbone (spinal column) is made up of 24 main vertebral bodies, the sacrum, and the coccyx. These are held together by muscles and tough, fibrous tissue (ligaments). Nerve roots pass through the openings between the vertebrae. A sudden move or injury to the back may cause injury to, or pressure on, these nerves. This may result in localized back pain or pain movement (radiation) into the buttocks, down the leg, and into the foot. Sharp, shooting pain from the buttock down the back of the leg (sciatica) is frequently associated with a ruptured (herniated) disk. Pain may be caused by muscle spasm alone. Your caregiver can often find the cause of your pain by the details of your symptoms and an exam. In some cases, you may need tests (such as X-rays). Your caregiver will work with you to decide if any tests are needed based on your specific exam. HOME CARE INSTRUCTIONS    Avoid an underactive lifestyle. Active exercise, as directed by your caregiver, is your greatest weapon against back pain.     Avoid hard physical activities (tennis, racquetball, waterskiing) if you are not in proper physical condition for it. This may aggravate or create problems.     If you have a back problem, avoid sports requiring sudden body movements. Swimming and walking are generally safer activities.     Maintain good posture.     Avoid becoming overweight (obese).     Use bed rest for only the most extreme, sudden (acute) episode. Your caregiver will help you determine how much bed rest is necessary.     For acute conditions, you may put ice on the injured area.     Put ice in a plastic bag.     Place a towel between your skin and the bag.     Leave the ice on for 15 to 20 minutes at a time, every 2 hours, or as  needed.     After you are improved and more active, it may help to apply heat for 30 minutes before activities.  See your caregiver if you are having pain that lasts longer than expected. Your caregiver can advise appropriate exercises or therapy if needed. With conditioning, most back problems can be avoided. SEEK IMMEDIATE MEDICAL CARE IF:    You have numbness, tingling, weakness, or problems with the use of your arms or legs.     You experience severe back pain not relieved with medicines.     There is a change in bowel or bladder control.     You have increasing pain in any area of the body, including your belly (abdomen).     You notice shortness of breath, dizziness, or feel faint.     You feel sick to your stomach (nauseous), are throwing up (vomiting), or become sweaty.     You notice discoloration of your toes or legs, or your feet get very cold.     Your back pain is getting worse.     You have a fever.  MAKE SURE YOU:    Understand these instructions.     Will watch your condition.     Will get help right away if you are not doing well or get worse.  Document Released: 07/26/2005 Document Revised: 06/28/2011 Document Reviewed: 01/15/2009 Exit[employer] Patient Information  [address] [place].
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
## 2011-09-15 ENCOUNTER — Ambulatory Visit: Payer: Medicaid Other | Admitting: Family Medicine

## 2011-09-20 ENCOUNTER — Encounter: Payer: Self-pay | Admitting: Family Medicine

## 2011-09-20 ENCOUNTER — Ambulatory Visit (INDEPENDENT_AMBULATORY_CARE_PROVIDER_SITE_OTHER): Payer: Medicaid Other | Admitting: Family Medicine

## 2011-09-20 DIAGNOSIS — K219 Gastro-esophageal reflux disease without esophagitis: Secondary | ICD-10-CM

## 2011-09-20 DIAGNOSIS — M545 Low back pain: Secondary | ICD-10-CM

## 2011-09-20 MED ORDER — OXYCODONE-ACETAMINOPHEN 7.5-325 MG PO TABS: 1.0000 | ORAL_TABLET | Freq: Two times a day (BID) | ORAL | Status: DC | PRN

## 2011-09-20 MED ORDER — OMEPRAZOLE 40 MG PO CPDR: 40.0000 mg | DELAYED_RELEASE_CAPSULE | Freq: Two times a day (BID) | ORAL | Status: AC

## 2011-09-20 MED ORDER — AMITRIPTYLINE HCL 25 MG PO TABS: 25.0000 mg | ORAL_TABLET | Freq: Three times a day (TID) | ORAL | Status: AC
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
## 2011-09-20 NOTE — Patient Instructions (Signed)
Congrats on wt loss. Congrats on decreasing smoking.  I hope you have stopped when I next see you. See me in one month. I switched from Nexium to omeprazole for your heartburn.  Start by taking it twice a day. If you heartburn comes under control, you can cut back to once a day.   I stopped you Naprosyn which should also help your heartburn.   I started a new drug, amitriptyline.  It should help your panic attacks and your nerve pain.  It may make you very sleepy at first.  You can start by taking it only once or twice a day.  As you get used to it, build up to three times a day.   I refilled your percocet at 60.  Next month, we will start cutting back.

## 2011-09-25 NOTE — Assessment & Plan Note (Signed)
Switched to high dose generic omeprozole due to nexium "not working" and cost.

## 2011-09-25 NOTE — Assessment & Plan Note (Signed)
She is asking for more pain meds, my long term plan is less narcotics.  Will start amitriptyline for neuropathic pain.

## 2011-10-18 ENCOUNTER — Ambulatory Visit: Payer: Medicaid Other | Admitting: Family Medicine

## 2011-10-20 ENCOUNTER — Ambulatory Visit (INDEPENDENT_AMBULATORY_CARE_PROVIDER_SITE_OTHER): Payer: Medicaid Other | Admitting: Family Medicine

## 2011-10-20 ENCOUNTER — Encounter: Payer: Self-pay | Admitting: Family Medicine

## 2011-10-20 VITALS — BP 125/85 | HR 94 | Ht 64.0 in | Wt 173.0 lb

## 2011-10-20 DIAGNOSIS — M545 Low back pain: Secondary | ICD-10-CM

## 2011-10-20 DIAGNOSIS — Z23 Encounter for immunization: Secondary | ICD-10-CM

## 2011-10-20 MED ORDER — OXYCODONE-ACETAMINOPHEN 7.5-325 MG PO TABS: 1.0000 | ORAL_TABLET | Freq: Two times a day (BID) | ORAL | Status: DC | PRN

## 2011-10-20 MED ORDER — OXYCODONE-ACETAMINOPHEN 7.5-325 MG PO TABS: 1.0000 | ORAL_TABLET | ORAL | Status: DC | PRN

## 2011-10-20 MED ORDER — CYCLOBENZAPRINE HCL 10 MG PO TABS: 10.0000 mg | ORAL_TABLET | Freq: Three times a day (TID) | ORAL | Status: AC | PRN

## 2011-10-20 NOTE — Patient Instructions (Signed)
You must keep track or your prescriptions. See me in three months. Continue to be active to strengthen your back.

## 2011-10-20 NOTE — Assessment & Plan Note (Signed)
Stable on current med regimine.  Will continue.  Will discuss weaning narcotics when infant no longer needs to be carried everywhere.

## 2011-10-20 NOTE — Progress Notes (Signed)
  Subjective:    Patient ID: Dorothy Allen, female    DOB: [birth year], 40 y.o.   MRN: [id number]  HPI Back pain continues.  Was without pain meds for three days and was "terrible."  While my long term plan is to wean, not this visit.  Needs flu shot.  Otherwise up to date on Health maint.    Stress level is high.  Husband disabled in pain clinic.  New baby and Christmas.  No major depression.      Review of Systems     Objective:   Physical Exam Affect good.  Able to lift and carry her 39 month old without too much discomfort.       Assessment & Plan:

## 2011-12-19 ENCOUNTER — Encounter: Payer: Self-pay | Admitting: Family Medicine

## 2011-12-19 ENCOUNTER — Telehealth: Payer: Self-pay | Admitting: *Deleted

## 2011-12-19 MED ORDER — OXYCODONE-ACETAMINOPHEN 5-325 MG PO TABS: 1.0000 | ORAL_TABLET | Freq: Three times a day (TID) | ORAL | Status: AC | PRN

## 2011-12-19 NOTE — Telephone Encounter (Signed)
Patient came in and exchanged prescriptions.  Dorothy Allen

## 2011-12-19 NOTE — Telephone Encounter (Signed)
Pharmacist calling to let Dr. Leveda Anna know that Percocet 7.5/325 is not commercially available.  Patient will need to come by and pick up new RX for either 5/325, 10/325 or Tylox 5/500.  Spoke with Dr. Leveda Anna.  Ok to change to 5/325.  Will forward to him to write new Rx for patient to pick up.  Ileana Ladd

## 2011-12-19 NOTE — Telephone Encounter (Signed)
Error

## 2011-12-19 NOTE — Telephone Encounter (Signed)
This encounter was created in error - please disregard.

## 2011-12-19 NOTE — Telephone Encounter (Signed)
Changed from 7.5/325 to 5/325.  Increased monthly supply from 60 to 85.

## 2011-12-22 ENCOUNTER — Ambulatory Visit: Payer: Medicaid Other | Admitting: Family Medicine

## 2011-12-27 ENCOUNTER — Ambulatory Visit: Payer: Medicaid Other | Admitting: Family Medicine

## 2012-01-02 ENCOUNTER — Encounter: Payer: Self-pay | Admitting: Family Medicine

## 2012-01-12 ENCOUNTER — Ambulatory Visit (INDEPENDENT_AMBULATORY_CARE_PROVIDER_SITE_OTHER): Payer: Medicaid Other | Admitting: Family Medicine

## 2012-01-12 ENCOUNTER — Encounter: Payer: Self-pay | Admitting: Family Medicine

## 2012-01-12 ENCOUNTER — Other Ambulatory Visit: Payer: Medicaid Other

## 2012-01-12 VITALS — BP 123/95 | HR 98 | Temp 97.8°F | Ht 64.0 in | Wt 175.0 lb

## 2012-01-12 DIAGNOSIS — M545 Low back pain: Secondary | ICD-10-CM

## 2012-01-12 DIAGNOSIS — K219 Gastro-esophageal reflux disease without esophagitis: Secondary | ICD-10-CM

## 2012-01-12 MED ORDER — OXYCODONE-ACETAMINOPHEN 5-325 MG PO TABS: 1.0000 | ORAL_TABLET | Freq: Three times a day (TID) | ORAL | Status: AC | PRN

## 2012-01-12 MED ORDER — FAMOTIDINE 40 MG PO TABS: 40.0000 mg | ORAL_TABLET | Freq: Every day | ORAL | Status: AC

## 2012-01-12 NOTE — Progress Notes (Signed)
  Subjective:    Patient ID: Dorothy Allen, female    DOB: [birth year], 41 y.o.   MRN: [id number]  HPI Dorothy Allen is a 41 yo F with PMHx of low back pain and GERD who presents for med refill and evaluation of hematemesis.  Pt reports that she experiences episodes during which she wakes up with acidic taste in her mouth, has "rotten egg burps," and vomits 2-3 times.  She says that these episodes occur about every 2 weeks and only last for one day.  She reports that she had similar episodes a few years ago, but her current bout started about 3 mos ago.  She cannot make any associations with foods, lifestyle, or situations.  She does report that she was switched from prilosec to nexium about 3 mos ago as her Medicaid did not cover prilosec.  She says that nothing seems to make the episodes better.  Denies fever, diarrhea, lightheadedness, SOB, weight loss.  She reports that lying down makes her reflux worse as do tomato-based foods.  Pt is also concerned about her current medication for anxiety and panic attacks.  She says that the amitriptyline makes her drowsy during the day.  She reports that she still has high anxiety some days.  Pt reports that she has taken celexa, paxil, and diazepam in the past for anxiety.  Celexa caused weight gain and paxil was not effective for symptoms.   Review of Systems  All other systems reviewed and are negative.      Objective:   Physical Exam  Constitutional: She appears well-developed and well-nourished.  HENT:  Mouth/Throat: No oral lesions. No dental abscesses or uvula swelling. No oropharyngeal exudate or posterior oropharyngeal erythema.       Patient is s/p tonsillectomy.   Neck: Normal range of motion. Neck supple.  Cardiovascular: Normal rate, regular rhythm and normal heart sounds.  Exam reveals no gallop and no friction rub.   No murmur heard. Pulmonary/Chest: Effort normal and breath sounds normal. No respiratory distress. She has no wheezes. She  has no rales. She exhibits no tenderness.  Lymphadenopathy:    She has no cervical adenopathy.  Skin: Skin is warm and dry.   Blood pressure 123/95, pulse 98, temperature 97.8 F (36.6 C), temperature source Oral, height 5\' 4"  (1.626 m), weight 175 lb (79.379 kg).      Assessment & Plan:

## 2012-01-12 NOTE — Patient Instructions (Signed)
Thank you for visiting [place] today. We started you on another medication for your reflux.  Take this once a day.  Continue taking omeprazole twice each day. Please schedule an abdominal ultrasound to evaluate your symptoms. We will see you back in 2-3 weeks to review all of the test results.

## 2012-01-15 NOTE — Assessment & Plan Note (Signed)
Stable on current meds.  No new symptoms and no significant improvement.

## 2012-01-15 NOTE — Assessment & Plan Note (Signed)
The symptoms do sound like reflux - but I am concerned that she has not responded to regular PPI.  Reflux may be severe and need higher dose (and consideration for laporoscopic fundoplication.)  Still, will broaden the differential and check abd ultrasound.

## 2012-01-18 ENCOUNTER — Telehealth: Payer: Self-pay | Admitting: *Deleted

## 2012-01-18 NOTE — Telephone Encounter (Signed)
Abdominal Ultrasound scheduled at [facility] ([address])  for 01/19/12 @ 8:45am.  Patient should be NPO after midnight.  I have called Ms. Erck numerous times this week to give her this information and have not gotten an answer.  Unable to leave message so I have called [facility] and cancelled this appointment.  If patient calls back we can reschedule this appointment.  Ileana Ladd

## 2012-01-19 ENCOUNTER — Other Ambulatory Visit: Payer: Medicaid Other

## 2012-01-31 ENCOUNTER — Ambulatory Visit: Payer: Medicaid Other | Admitting: Family Medicine

## 2012-01-31 ENCOUNTER — Other Ambulatory Visit (INDEPENDENT_AMBULATORY_CARE_PROVIDER_SITE_OTHER): Payer: Self-pay

## 2012-01-31 DIAGNOSIS — M545 Low back pain, unspecified: Secondary | ICD-10-CM

## 2012-01-31 DIAGNOSIS — K219 Gastro-esophageal reflux disease without esophagitis: Secondary | ICD-10-CM

## 2012-01-31 LAB — CBC
HCT: 50.5 % — ABNORMAL HIGH (ref 36.0–46.0)
MCHC: 32.1 g/dL (ref 30.0–36.0)
MCV: 91.7 fL (ref 78.0–100.0)
RDW: 13.9 % (ref 11.5–15.5)

## 2012-01-31 NOTE — Progress Notes (Signed)
Cbc and hpylori done today [facility] Dorothy Allen

## 2012-02-01 ENCOUNTER — Telehealth: Payer: Self-pay | Admitting: *Deleted

## 2012-02-01 NOTE — Progress Notes (Signed)
  Subjective:    Patient ID: Dorothy Allen, female    DOB: [birth year], 41 y.o.   MRN: [id number]  HPI called and given results.  Patient states she has not yet had ultrasound scheduled.  Will forward to nurse to arrange.    Review of Systems     Objective:   Physical Exam        Assessment & Plan:

## 2012-02-01 NOTE — Telephone Encounter (Signed)
Spoke with patient about setting up [place] ordered and she will contact me when her insurance goes through so [place] can be set up

## 2012-02-13 ENCOUNTER — Telehealth: Payer: Self-pay | Admitting: Family Medicine

## 2012-02-13 NOTE — Telephone Encounter (Signed)
This pt is a SDA only pt since 3.15.2013. Pt stated that she had spoken to you (Dr. Leveda Anna) concerning her insurance problem that she is having and that you (Dr. Leveda Anna) informed her that you understood the issues that she was having and that anytime she needed a refill that you (Dr. Leveda Anna) would put the script up front for her to pick up. also pt was to have had pelvic US done that was scheduled for her but due to the pt not being able to be reached this was cancelled and pt was to reschedule and has not done this as of this date spoke with pt and she stated that her medicaid is no longer in effect and is in the process of getting it reinstated.  I explained to pt that due to her status of being an SDA pt that she will have to come in to be seen in order to get her medication refilled.   Looking back at a previous OV note it was noted that Dr. Leveda Anna stated:    Able to lift and carry her 84 month old without too much discomfort.   Low back pain radiating to both legs - Sanjuana Letters, MD 10/20/2011 4:07 PM Signed  Stable on current med regimine. Will continue. Will discuss weaning narcotics when infant no longer needs to be carried everywhere.  Laureen Ochs, Viann Shove

## 2012-02-13 NOTE — Telephone Encounter (Signed)
Requesting refill rx for oxycodone to left up front for pickup.

## 2012-02-16 MED ORDER — HYDROCODONE-ACETAMINOPHEN 5-500 MG PO TABS: 1.0000 | ORAL_TABLET | Freq: Three times a day (TID) | ORAL | Status: AC | PRN

## 2012-02-16 NOTE — Telephone Encounter (Signed)
I appreciate Anitra Lauth handling of this while I am out of the office.  Good teamwork.

## 2012-02-16 NOTE — Telephone Encounter (Signed)
Called and pt was not home spoke with her husband Viviann Spare and I informed him that Dr. Leveda Anna will give her an Rx for Vicodin 5-500 for her pain and that she will need to follow up with him if this is not sufficient for her. She will only receive #90 tablets with no refills on this medication. I asked him what pharmacy he stated [facility]. I told him that I would be calling this in for her and to give them about an 1 hour before going to pick it up. He stated that he would inform her of this.Heath Gold   Forwarded to Dr. Leveda Anna for review.Loralee Pacas [place]

## 2012-02-16 NOTE — Telephone Encounter (Signed)
As is expected, I have a different recollection of the conversation than the patient.  I am out of the office today.  Nurses please call patient and give her this message: She has an obligation to do her part.  That includes: 1. Keeping appointments at our office and with Xray and others. 2. Keeping her Medicaid and [employer] certification in effect.  In return, I will continue to work with her in controlling her pain while at the same time weaning her from narcotics.  She has been on high dose oxycodone, which was weaned from 7.5 to 5 last visit.  As an act of good faith I am willing to call in hydrocodone/APAP 5/500, #90 One po tid prn pain.  No refills.  If this is not sufficient for her, she will need to wait for an appointment to see me.  If it is OK, please call in this Rx for me and document in chart.

## 2012-03-11 ENCOUNTER — Other Ambulatory Visit: Payer: Self-pay | Admitting: Family Medicine

## 2012-03-11 DIAGNOSIS — M545 Low back pain: Secondary | ICD-10-CM

## 2012-03-11 MED ORDER — HYDROCODONE-ACETAMINOPHEN 5-500 MG PO CAPS: 1.0000 | ORAL_CAPSULE | Freq: Four times a day (QID) | ORAL | Status: AC | PRN

## 2012-03-11 NOTE — Assessment & Plan Note (Addendum)
Received fax request to refill hydrocodone.  While patient has not been seen, I have been stepping down her narcotic dose.  I made a major step down when I switched her from oxycodone to hydrocodone last month.  I will keep her on the same dose this month.  Also checked drug data base and she has only received controled substances from me in 2013.

## 2012-03-12 ENCOUNTER — Ambulatory Visit (INDEPENDENT_AMBULATORY_CARE_PROVIDER_SITE_OTHER): Payer: Self-pay | Admitting: Family Medicine

## 2012-03-12 ENCOUNTER — Encounter: Payer: Self-pay | Admitting: Family Medicine

## 2012-03-12 VITALS — BP 141/103 | HR 107 | Temp 97.5°F | Ht 64.0 in | Wt 177.5 lb

## 2012-03-12 DIAGNOSIS — K921 Melena: Secondary | ICD-10-CM | POA: Insufficient documentation

## 2012-03-12 DIAGNOSIS — R197 Diarrhea, unspecified: Secondary | ICD-10-CM | POA: Insufficient documentation

## 2012-03-12 DIAGNOSIS — R110 Nausea: Secondary | ICD-10-CM | POA: Insufficient documentation

## 2012-03-12 MED ORDER — DIPHENOXYLATE-ATROPINE 2.5-0.025 MG PO TABS: 1.0000 | ORAL_TABLET | Freq: Four times a day (QID) | ORAL | Status: AC | PRN

## 2012-03-12 MED ORDER — ONDANSETRON 4 MG PO TBDP: 4.0000 mg | ORAL_TABLET | Freq: Three times a day (TID) | ORAL | Status: AC | PRN

## 2012-03-12 NOTE — Progress Notes (Signed)
  Subjective:   Patient ID: Dorothy Allen, female DOB: 1971-05-19 41 y.o. MRN: [id number] HPI:  1. [place] stool Patient was recently seen for abdominal pain. She was dx with GERD and was scheduled for an abd. Ultrasound to rule out biliary source. She returns today requesting pain medication refill because she is out of her Vicodin.  She also complains of nausea without emesis, stool smelling belching, diarrhea.  Onset: has been acute  Time period of: 1 week(s).  Severity is described as moderate to severe.  Course of her symptoms over time is acute and worsening. Aggravating: having bowel movements.  Alleviating: none Associated sx/sn: Nausea, foul smelling belching. No dizziness, no falls, no syncope. No oral bleeding. No blood in stool noted. Not sticky stools.   History  Substance Use Topics  . Smoking status: Current Everyday Smoker -- 0.5 packs/day    Types: Cigarettes  . Smokeless tobacco: Not on file  . Alcohol Use: No   Review of Systems: Pertinent items are noted in HPI.  Labs Reviewed: last labs. She has not obtained ultrasound yet.     Objective:   Filed Vitals:   03/12/12 1523  BP: 141/103  Pulse: 107  Temp: 97.5 F (36.4 C)  TempSrc: Oral  Height: 5\' 4"  (1.626 m)  Weight: 177 lb 8 oz (80.513 kg)   Physical Exam: General: c/f, nad, pleasant. Abdomen: soft and non-tender without masses, organomegaly or hernias noted.  No guarding or rebound Lungs:  Normal respiratory effort, chest expands symmetrically. Lungs are clear to auscultation, no crackles or wheezes. Heart - Regular rate and rhythm.  No murmurs, gallops or rubs.    Extremities:   Non-tender, No cyanosis, edema, or deformity noted.  Assessment & Plan:

## 2012-03-12 NOTE — Patient Instructions (Signed)
It was great to see you today!  Schedule an appointment to see Dr. Leveda Anna for your pain medication refill.   It is important to get your abdominal ultrasound done to figure out the cause of your pain/foul smelling burping.  I have ordered testing on your stool to make sure you do not have bleeding or an infection.

## 2012-03-12 NOTE — Assessment & Plan Note (Signed)
Patient is having foul smelling burping associated with nausea. This could still be related to her GERD as discussed her last visit. She is also having foul smelling diarrhea which could be related to coming down on her opioids or another cause.

## 2012-03-12 NOTE — Assessment & Plan Note (Signed)
This makes me worried about bleeding, or infection.  Will test her stools for blood and cdiff. Could be diarrhea from her coming down on opioids, although she is still taking vicodin.

## 2012-03-20 ENCOUNTER — Ambulatory Visit: Payer: Self-pay | Admitting: Family Medicine

## 2012-04-17 ENCOUNTER — Other Ambulatory Visit: Payer: Self-pay | Admitting: Family Medicine

## 2012-04-17 DIAGNOSIS — M545 Low back pain: Secondary | ICD-10-CM

## 2012-04-17 MED ORDER — HYDROCODONE-ACETAMINOPHEN 5-500 MG PO CAPS: 1.0000 | ORAL_CAPSULE | Freq: Four times a day (QID) | ORAL | Status: AC | PRN

## 2012-04-17 NOTE — Assessment & Plan Note (Signed)
Received fax request for hydrocodone refill.  She has not been good about keeping follow up appointments.  Last Rx was for three months of hydrocodone/APAP 5/500 #90, which was a good stepdown from oxycodone.  She still has young children so she has some lifting.  I did check [place] controled substance data base and there were no outside prescriptions.  Will keep drug the same but decrease to #60/month.

## 2012-07-23 ENCOUNTER — Other Ambulatory Visit: Payer: Self-pay | Admitting: Family Medicine

## 2017-04-28 ENCOUNTER — Emergency Department (HOSPITAL_BASED_OUTPATIENT_CLINIC_OR_DEPARTMENT_OTHER)
Admission: EM | Admit: 2017-04-28 | Discharge: 2017-04-28 | Discharge status: Against Medical Advice | Payer: Self-pay | Attending: Emergency Medicine | Admitting: Emergency Medicine

## 2017-04-28 ENCOUNTER — Encounter (HOSPITAL_BASED_OUTPATIENT_CLINIC_OR_DEPARTMENT_OTHER): Payer: Self-pay | Admitting: *Deleted

## 2017-04-28 DIAGNOSIS — F17210 Nicotine dependence, cigarettes, uncomplicated: Secondary | ICD-10-CM | POA: Insufficient documentation

## 2017-04-28 DIAGNOSIS — R591 Generalized enlarged lymph nodes: Secondary | ICD-10-CM

## 2017-04-28 DIAGNOSIS — R590 Localized enlarged lymph nodes: Secondary | ICD-10-CM | POA: Insufficient documentation

## 2017-04-28 DIAGNOSIS — L508 Other urticaria: Secondary | ICD-10-CM | POA: Insufficient documentation

## 2017-04-28 DIAGNOSIS — Z91040 Latex allergy status: Secondary | ICD-10-CM | POA: Insufficient documentation

## 2017-04-28 DIAGNOSIS — Z5329 Procedure and treatment not carried out because of patient's decision for other reasons: Secondary | ICD-10-CM | POA: Insufficient documentation

## 2017-04-28 DIAGNOSIS — N611 Abscess of the breast and nipple: Secondary | ICD-10-CM | POA: Insufficient documentation

## 2017-04-28 HISTORY — DX: Unspecified cirrhosis of liver: K74.60

## 2017-04-28 HISTORY — DX: Unspecified osteoarthritis, unspecified site: M19.90

## 2017-04-28 HISTORY — DX: Pain, unspecified: R52

## 2017-04-28 HISTORY — DX: Unspecified pre-eclampsia, unspecified trimester: O14.90

## 2017-04-28 HISTORY — DX: Endometriosis, unspecified: N80.9

## 2017-04-28 MED ORDER — SODIUM CHLORIDE 0.9 % IV BOLUS (SEPSIS): 1000.0000 mL | Freq: Once | INTRAVENOUS | Status: DC

## 2017-04-28 MED ORDER — DIPHENHYDRAMINE HCL 50 MG/ML IJ SOLN: 25.0000 mg | Freq: Once | INTRAMUSCULAR | Status: DC

## 2017-04-28 NOTE — ED Provider Notes (Signed)
[facility] Provider Note   CSN: [id number] Arrival date & time: 04/28/17  1744  By signing my name below, I, Thelma Barge, attest that this documentation has been prepared under the direction and in the presence of Tilden Fossa, MD. Electronically Signed: Thelma Barge, Scribe. 04/28/17. 8:17 PM.  History   Chief Complaint Chief Complaint  Patient presents with  . Lymphadenopathy  . Emesis   The history is provided by the patient. No language interpreter was used.    HPI Comments: Dorothy Allen is a 46 y.o. female with a PMHx of chronic back pain, kidney problems who presents to the Emergency Department complaining of constant, gradually worsening lymphadenopathy for 3 days. She has associated fever (102.2), diarrhea, vomiting (with 3 episodes in the last 3 days), and itchy rashes across her body. She has taken a fever reducer with some relief.  She denies abdominal pain and dysuria. She also denies taking any new medications or being exposed to any ticks. Pt moved to [place] from [place] a month ago. Pt smokes but denies IVDU or alcohol use. She has allergies to hydrocodone, penicillins, and was told to not take acetaminophen due to her liver issues.  She also complains of left-sided breast pain for 5 days. She noticed a swollen bump in the area underneath her breast.  Pt reports wanting to leave AMA to go pick up her kids.  Past Medical History:  Diagnosis Date  . Arthritis   . Cirrhosis of liver not due to alcohol (HCC)   . Endometriosis   . Pain management   . Preeclampsia     Patient Active Problem List   Diagnosis Date Noted  . Diarrhea 03/12/2012  . Black tarry stools 03/12/2012  . Nausea alone 03/12/2012  . Low back pain radiating to both legs 08/23/2011  . Esophageal reflux disease 08/23/2011    Past Surgical History:  Procedure Laterality Date  . CESAREAN SECTION    . CHOLECYSTECTOMY    . TONSILLECTOMY    . TUBAL LIGATION      OB History    No data  available       Home Medications    Prior to Admission medications   Medication Sig Start Date End Date Taking? Authorizing Provider  Multiple Vitamin (MULTIVITAMIN) tablet Take 1 tablet by mouth daily.     Yes [provider]  amitriptyline (ELAVIL) 25 MG tablet Take 1 tablet (25 mg total) by mouth 3 (three) times daily. 09/20/11 09/19/12  Moses Manners, MD  hydrocodone-acetaminophen (LORCET-HD) 5-500 MG per capsule Take 1 capsule by mouth every 6 (six) hours as needed for pain. 03/11/12 03/21/12  Moses Manners, MD  hydrocodone-acetaminophen (LORCET-HD) 5-500 MG per capsule Take 1 capsule by mouth every 6 (six) hours as needed for pain. 04/17/12 04/27/12  Moses Manners, MD  HYDROcodone-acetaminophen (VICODIN) 5-500 MG per tablet Take 1 tablet by mouth 3 (three) times daily as needed for pain. 02/16/12 02/26/12  Moses Manners, MD  omeprazole (PRILOSEC) 40 MG capsule Take 1 capsule (40 mg total) by mouth 2 (two) times daily. 09/20/11 09/19/12  Moses Manners, MD    Family History No family history on file.  Social History Social History  Substance Use Topics  . Smoking status: Current Every Day Smoker    Packs/day: 0.50    Types: Cigarettes  . Smokeless tobacco: Never Used  . Alcohol use No     Allergies   Codeine; Penicillins; and Latex   Review  of Systems Review of Systems  Constitutional: Positive for fever.  Gastrointestinal: Positive for diarrhea and vomiting. Negative for abdominal pain.  Genitourinary: Negative for dysuria.  Skin: Positive for rash and wound.  Hematological: Positive for adenopathy.  All other systems reviewed and are negative.    Physical Exam Updated Vital Signs BP (!) 150/116 (BP Location: Left Arm)   Pulse (!) 121   Temp 98.3 F (36.8 C) (Oral)   Resp 16   Ht 5\' 4"  (1.626 m)   Wt 160 lb (72.6 kg)   LMP 04/27/2017   SpO2 100%   BMI 27.46 kg/m   Physical Exam  Constitutional: She is oriented to person, place,  and time. She appears well-developed and well-nourished.  HENT:  Head: Normocephalic and atraumatic.  Neck:  Tender cervical lympadenopathy  Cardiovascular: Regular rhythm.   No murmur heard. Tachycardic  Pulmonary/Chest: Effort normal and breath sounds normal. No respiratory distress.  Superficial, less than 1 cm abscess to the left breast was focal pointing, no surrounding cellulitis. No drainage.  Abdominal: Soft. There is no tenderness. There is no rebound and no guarding.  Genitourinary:  Genitourinary Comments:    Musculoskeletal: She exhibits no edema or tenderness.  Lymphadenopathy:    She has cervical adenopathy.  Neurological: She is alert and oriented to person, place, and time.  Skin: Skin is warm and dry. Rash noted.  Diffuse urticarial rash of the trunk and bilateral upper extremities  Psychiatric: She has a normal mood and affect. Her behavior is normal.  Nursing note and vitals reviewed.   ED Treatments / Results  DIAGNOSTIC STUDIES: Oxygen Saturation is 100% on RA, normal by my interpretation.    COORDINATION OF CARE: 7:09 PM Discussed treatment plan with pt at bedside and pt agreed to plan.  Labs (all labs ordered are listed, but only abnormal results are displayed) Labs Reviewed - No data to display  EKG  EKG Interpretation None       Radiology No results found.  Procedures Procedures (including critical care time)  Medications Ordered in ED Medications - No data to display   Initial Impression / Assessment and Plan / ED Course  I have reviewed the triage vital signs and the nursing notes.  Pertinent labs & imaging results that were available during my care of the patient were reviewed by me and considered in my medical decision making (see chart for details).     Patient here for evaluation of lymphadenopathy, breast wounds vomiting. She has diffuse urticaria as well as cervical lymphadenopathy, superficial breast abscess. Discussed with  patient recommendation for treatment with IV fluids, checking labs, Benadryl and further workup and assessment. Patient declined workup and evaluation and states she needs to leave to take care of family members. Discussed with patient concern for possible infection and need for further treatment and workup. She declines any further evaluation at this time. She states she will return immediately.  Final Clinical Impressions(s) / ED Diagnoses   Final diagnoses:  Lymphadenopathy of head and neck    New Prescriptions New Prescriptions   No medications on file  I personally performed the services described in this documentation, which was scribed in my presence. The recorded information has been reviewed and is accurate.    Tilden Fossaees, Doreatha Offer, MD 04/29/17 [phone]

## 2017-04-28 NOTE — ED Notes (Signed)
Family member stepped out of room and states that they need to leave due to her baby sitter "called out' and she needed to go home. AMA procedure explained to family member. Family member returned to room. Then Dr Madilyn Hookees walked in room behind her.

## 2017-04-28 NOTE — ED Notes (Signed)
ED Provider at bedside. 

## 2017-04-28 NOTE — ED Triage Notes (Signed)
Pt also reports she thinks she was bitten by an insect on L breast approx 5 days ago. Reports swelling.

## 2017-04-28 NOTE — ED Triage Notes (Signed)
Pt reports fever (high of 102), n/v/d and L neck pain/swelling that began 2 days ago. Pt states she's run out of [facility] [place] [facility] and needs to get referred to pain management. Denies chest pain, sob.

## 2017-04-28 NOTE — ED Notes (Signed)
PT states she has to go home and get her husband and come back to check back in. Dr Madilyn Hookees aware

## 2017-11-23 ENCOUNTER — Emergency Department (HOSPITAL_BASED_OUTPATIENT_CLINIC_OR_DEPARTMENT_OTHER)
Admission: EM | Admit: 2017-11-23 | Discharge: 2017-11-23 | Disposition: A | Discharge status: Home / Self Care | Payer: Medicaid Other | Attending: Emergency Medicine | Admitting: Emergency Medicine

## 2017-11-23 ENCOUNTER — Encounter (HOSPITAL_BASED_OUTPATIENT_CLINIC_OR_DEPARTMENT_OTHER): Payer: Self-pay | Admitting: *Deleted

## 2017-11-23 ENCOUNTER — Other Ambulatory Visit: Payer: Self-pay

## 2017-11-23 DIAGNOSIS — R0981 Nasal congestion: Secondary | ICD-10-CM | POA: Diagnosis not present

## 2017-11-23 DIAGNOSIS — Z5321 Procedure and treatment not carried out due to patient leaving prior to being seen by health care provider: Secondary | ICD-10-CM | POA: Diagnosis not present

## 2017-11-23 DIAGNOSIS — R05 Cough: Secondary | ICD-10-CM | POA: Diagnosis not present

## 2017-11-23 DIAGNOSIS — K1370 Unspecified lesions of oral mucosa: Secondary | ICD-10-CM | POA: Diagnosis not present

## 2017-11-23 NOTE — ED Triage Notes (Signed)
Pt reports blisters in her mouth x 3 days along with congestion and cough.

## 2017-11-23 NOTE — ED Notes (Signed)
Pt came to the desk and advised she called her urgent care and was going to leave to go there. Pt disposition set to LWBS.

## 2018-05-24 ENCOUNTER — Other Ambulatory Visit: Payer: Self-pay

## 2018-05-24 ENCOUNTER — Emergency Department (HOSPITAL_BASED_OUTPATIENT_CLINIC_OR_DEPARTMENT_OTHER): Payer: Medicaid Other

## 2018-05-24 ENCOUNTER — Encounter (HOSPITAL_BASED_OUTPATIENT_CLINIC_OR_DEPARTMENT_OTHER): Payer: Self-pay | Admitting: Emergency Medicine

## 2018-05-24 ENCOUNTER — Emergency Department (HOSPITAL_BASED_OUTPATIENT_CLINIC_OR_DEPARTMENT_OTHER)
Admission: EM | Admit: 2018-05-24 | Discharge: 2018-05-24 | Disposition: A | Discharge status: Home / Self Care | Payer: Medicaid Other | Attending: Emergency Medicine | Admitting: Emergency Medicine

## 2018-05-24 DIAGNOSIS — Z79899 Other long term (current) drug therapy: Secondary | ICD-10-CM | POA: Insufficient documentation

## 2018-05-24 DIAGNOSIS — M79642 Pain in left hand: Secondary | ICD-10-CM | POA: Diagnosis present

## 2018-05-24 DIAGNOSIS — I808 Phlebitis and thrombophlebitis of other sites: Secondary | ICD-10-CM | POA: Insufficient documentation

## 2018-05-24 DIAGNOSIS — Z91040 Latex allergy status: Secondary | ICD-10-CM | POA: Diagnosis not present

## 2018-05-24 DIAGNOSIS — F17210 Nicotine dependence, cigarettes, uncomplicated: Secondary | ICD-10-CM | POA: Diagnosis not present

## 2018-05-24 DIAGNOSIS — I809 Phlebitis and thrombophlebitis of unspecified site: Secondary | ICD-10-CM

## 2018-05-24 MED ORDER — NAPROXEN 375 MG PO TABS: 375.0000 mg | ORAL_TABLET | Freq: Two times a day (BID) | ORAL | 0 refills | Status: AC

## 2018-05-24 NOTE — ED Provider Notes (Signed)
[facility] EMERGENCY DEPARTMENT Provider Note   CSN: [id number] Arrival date & time: 05/24/18  1807     History   Chief Complaint Chief Complaint  Patient presents with  . Hand Pain    HPI Dorothy Allen is a 47 y.o. female.  Who presents with a chief complaint of left hand pain.  Patient states that she gives plasma twice a week.  3 weeks ago her IV infiltrated and she has had progressive pain and swelling in the left hand.  She took Motrin which gave her some relief but has not been working as of late.  She denies fevers or chills. HPI  Past Medical History:  Diagnosis Date  . Arthritis   . Cirrhosis of liver not due to alcohol (HCC)   . Endometriosis   . Pain management   . Preeclampsia     Patient Active Problem List   Diagnosis Date Noted  . Diarrhea 03/12/2012  . Black tarry stools 03/12/2012  . Nausea alone 03/12/2012  . Low back pain radiating to both legs 08/23/2011  . Esophageal reflux disease 08/23/2011    Past Surgical History:  Procedure Laterality Date  . CESAREAN SECTION    . CHOLECYSTECTOMY    . TONSILLECTOMY    . TUBAL LIGATION       OB History   None      Home Medications    Prior to Admission medications   Medication Sig Start Date End Date Taking? Authorizing Provider  amitriptyline (ELAVIL) 25 MG tablet Take 1 tablet (25 mg total) by mouth 3 (three) times daily. 09/20/11 09/19/12  Moses Manners, MD  ARIPiprazole (ABILIFY DISCMELT PO) Take by mouth.    [provider]  BusPIRone HCl (BUSPAR PO) Take by mouth.    [provider]  hydrocodone-acetaminophen (LORCET-HD) 5-500 MG per capsule Take 1 capsule by mouth every 6 (six) hours as needed for pain. 03/11/12 03/21/12  Moses Manners, MD  hydrocodone-acetaminophen (LORCET-HD) 5-500 MG per capsule Take 1 capsule by mouth every 6 (six) hours as needed for pain. 04/17/12 04/27/12  Moses Manners, MD  HYDROcodone-acetaminophen (VICODIN) 5-500 MG per  tablet Take 1 tablet by mouth 3 (three) times daily as needed for pain. 02/16/12 02/26/12  Moses Manners, MD  Multiple Vitamin (MULTIVITAMIN) tablet Take 1 tablet by mouth daily.      [provider]  omeprazole (PRILOSEC) 40 MG capsule Take 1 capsule (40 mg total) by mouth 2 (two) times daily. 09/20/11 09/19/12  Moses Manners, MD    Family History No family history on file.  Social History Social History   Tobacco Use  . Smoking status: Current Every Day Smoker    Packs/day: 0.50    Types: Cigarettes  . Smokeless tobacco: Never Used  Substance Use Topics  . Alcohol use: No  . Drug use: No     Allergies   Codeine; Penicillins; and Latex   Review of Systems Review of Systems Ten systems reviewed and are negative for acute change, except as noted in the HPI.    Physical Exam Updated Vital Signs BP (!) 141/93 (BP Location: Right Arm)   Pulse 72   Temp 98.2 F (36.8 C) (Oral)   Resp 18   Ht 5\' 4"  (1.626 m)   Wt 74.8 kg (165 lb)   LMP 04/26/2018   SpO2 98%   BMI 28.32 kg/m   Physical Exam  Constitutional: She is oriented to person, place, and time. She  appears well-developed and well-nourished. No distress.  HENT:  Head: Normocephalic and atraumatic.  Eyes: Pupils are equal, round, and reactive to light. Conjunctivae and EOM are normal. No scleral icterus.  Neck: Normal range of motion.  Cardiovascular: Normal rate, regular rhythm and normal heart sounds. Exam reveals no gallop and no friction rub.  No murmur heard. Pulmonary/Chest: Effort normal and breath sounds normal. No respiratory distress.  Abdominal: Soft. Bowel sounds are normal. She exhibits no distension and no mass. There is no tenderness. There is no guarding.  Musculoskeletal:  Left hand with tender, ropey, cord in the Left hand/ wrist. Her there is mild surrounding tissue swelling.  Pain with tenderness to palpation.  No erythema or red streaking  Neurological: She is alert and  oriented to person, place, and time.  Skin: Skin is warm and dry. She is not diaphoretic.  Psychiatric: Her behavior is normal.  Nursing note and vitals reviewed.    ED Treatments / Results  Labs (all labs ordered are listed, but only abnormal results are displayed) Labs Reviewed - No data to display  EKG None  Radiology Dg Hand Complete Left  Result Date: 05/24/2018 CLINICAL DATA:  Left hand swelling and stiffness for 1 month. No injury. EXAM: LEFT HAND - COMPLETE 3+ VIEW COMPARISON:  None. FINDINGS: Swelling with no underlying bony abnormality. No fracture or bony erosion. IMPRESSION: Soft tissue swelling of uncertain etiology. No bony abnormality noted. Electronically Signed   By: Gerome Samavid  Williams III M.D   On: 05/24/2018 19:01    Procedures Procedures (including critical care time)  Medications Ordered in ED Medications - No data to display   Initial Impression / Assessment and Plan / ED Course  I have reviewed the triage vital signs and the nursing notes.  Pertinent labs & imaging results that were available during my care of the patient were reviewed by me and considered in my medical decision making (see chart for details).     Patient with tenderness over the left wrist consistent with thrombophlebitis which I believe is secondary to her frequent accessing from plasma donation.  She does not appear to have any extensive swelling in the arm suggestive of deep vein thrombosis.  No evidence of cellulitis.  Seen and shared visit with Dr. Anitra LauthPlunkett who agrees with my assessment.  Supportive care discussed with the patient and return precautions discussed.  She is hemodynamically stable and appears appropriate for discharge at this time.  Final Clinical Impressions(s) / ED Diagnoses   Final diagnoses:  None    ED Discharge Orders    None       Arthor CaptainHarris, Kellie Murrill, PA-C 05/25/18 0033    Gwyneth SproutPlunkett, [place], MD 05/26/18 [phone]

## 2018-05-24 NOTE — ED Triage Notes (Addendum)
States had gotten a knot to her left hand x 1 month ago and now the pain is getting worse. Swelling noted to left hand. Pain after hitting hand in pool x 1 month ago

## 2018-05-24 NOTE — Discharge Instructions (Signed)
Get help right away if: You have a sudden onset of chest pain or difficulty breathing. You have a fever or persistent symptoms for more than 2-3 days. You have a fever and your symptoms suddenly get worse.

## 2018-12-14 IMAGING — CR DG HAND COMPLETE 3+V*L*
3 series · 3 of 3 positions shown · non-contrast
Comparison: None.

CLINICAL DATA: Left hand swelling and stiffness for 1 month. No
injury.

EXAM:
LEFT HAND - COMPLETE 3+ VIEW

[x hand pa left]
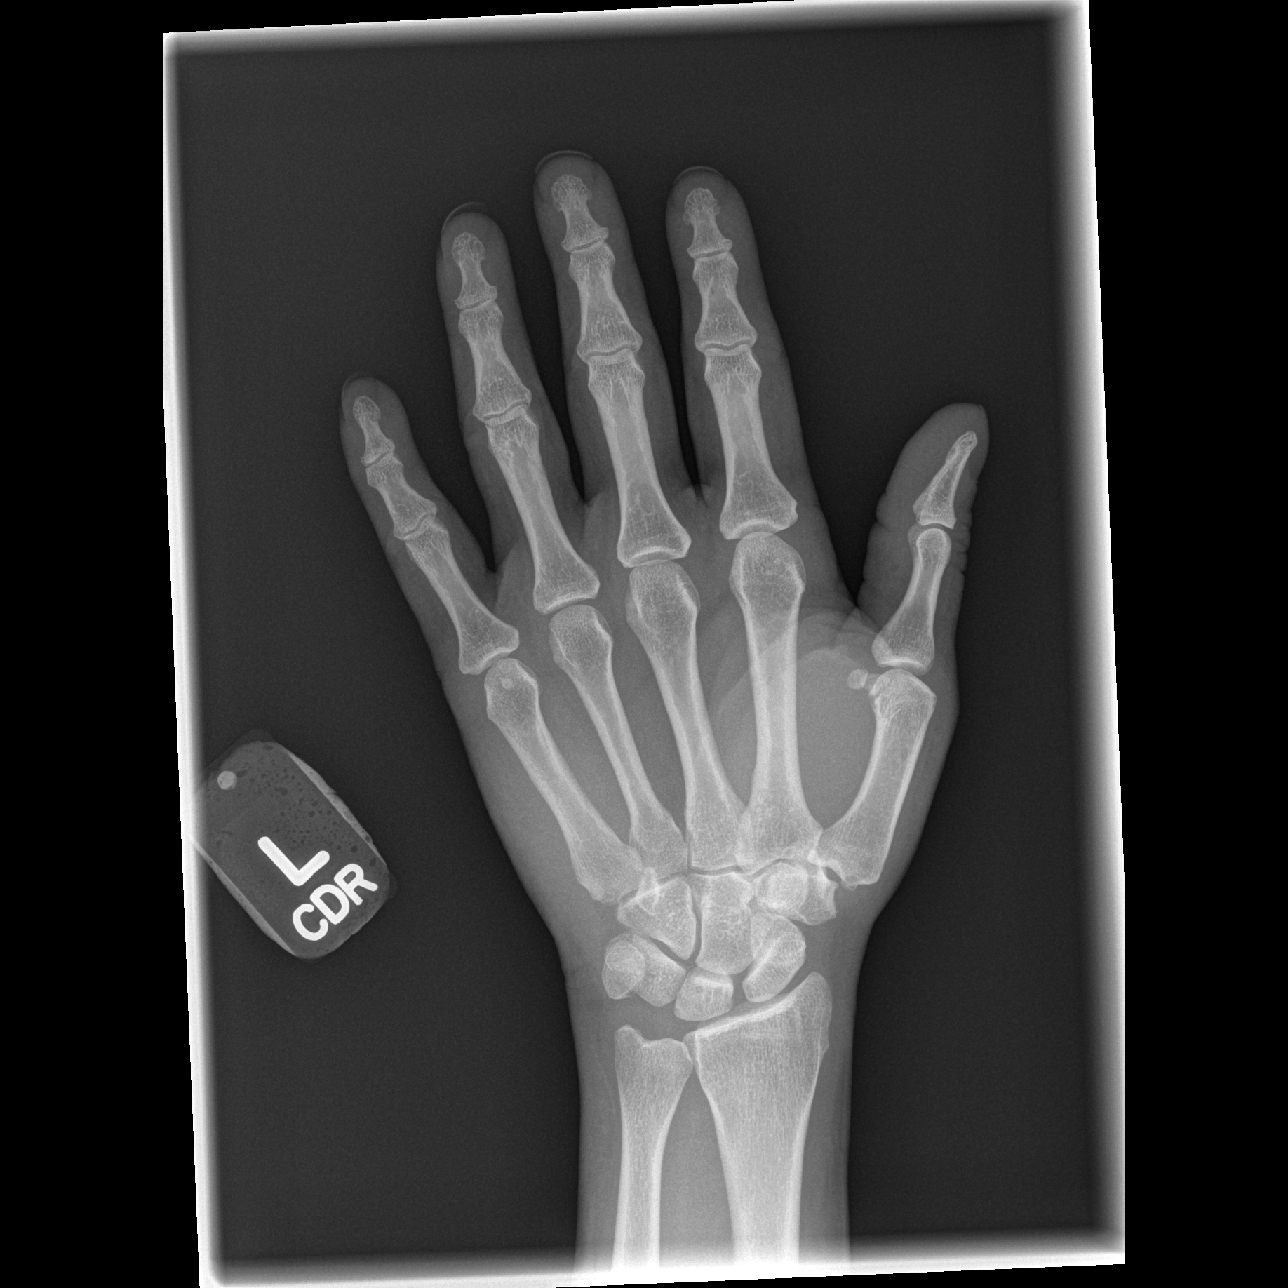

[x hand oblique left]
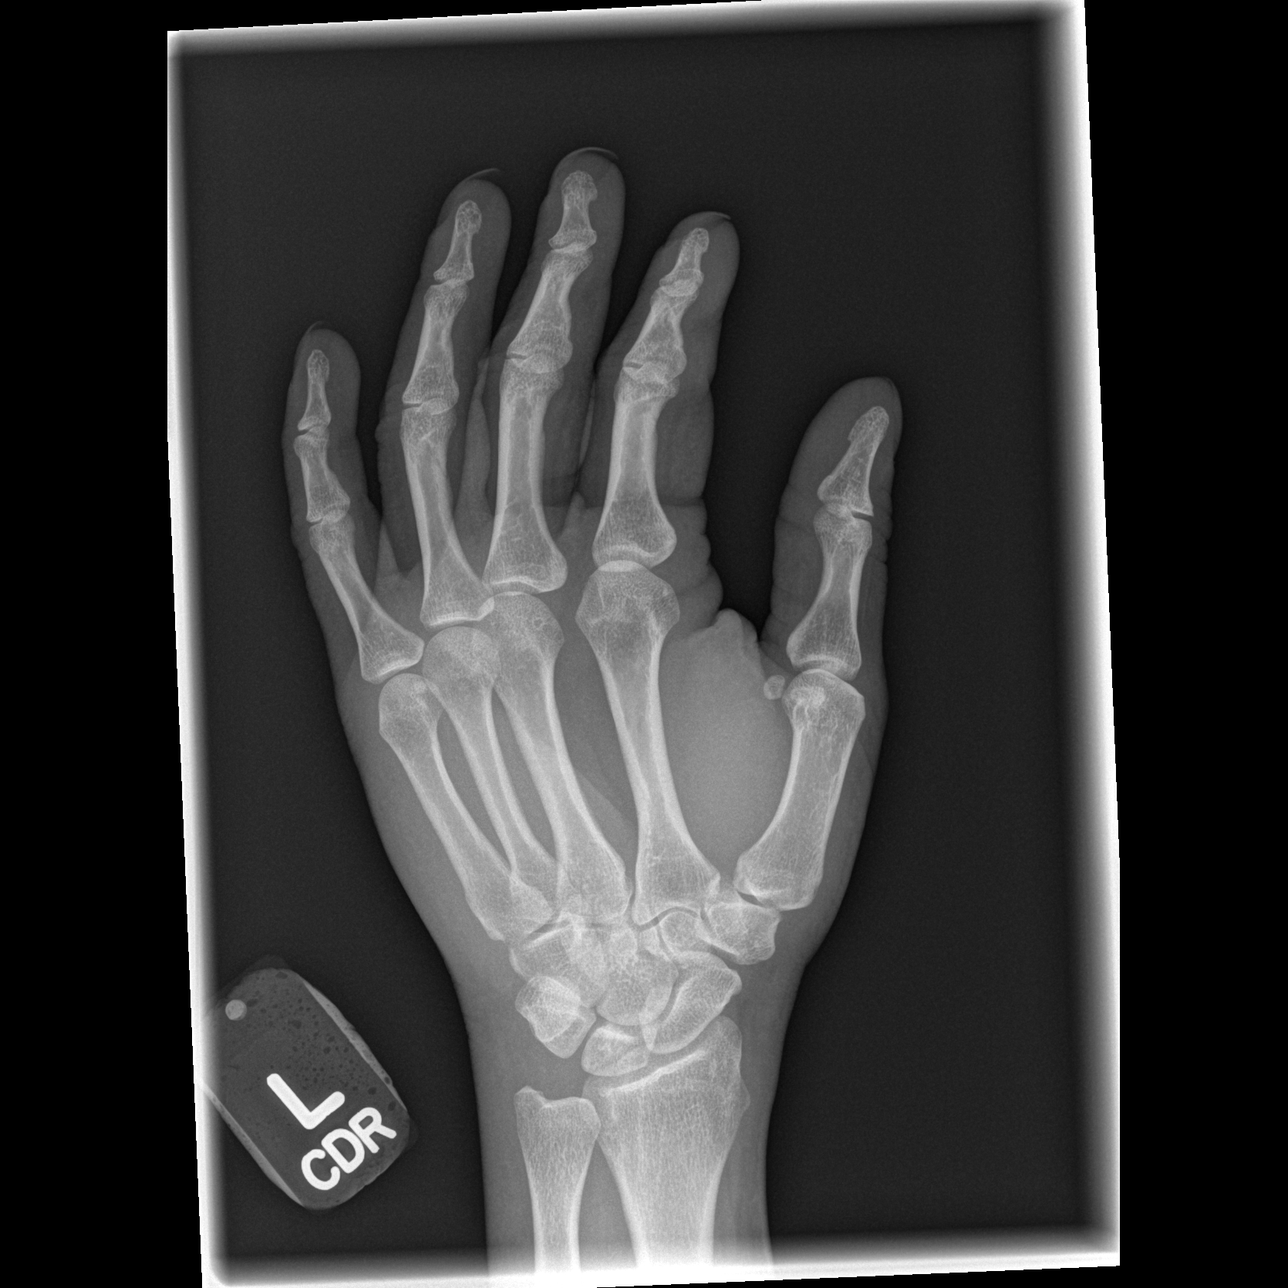

[x hand lat left]
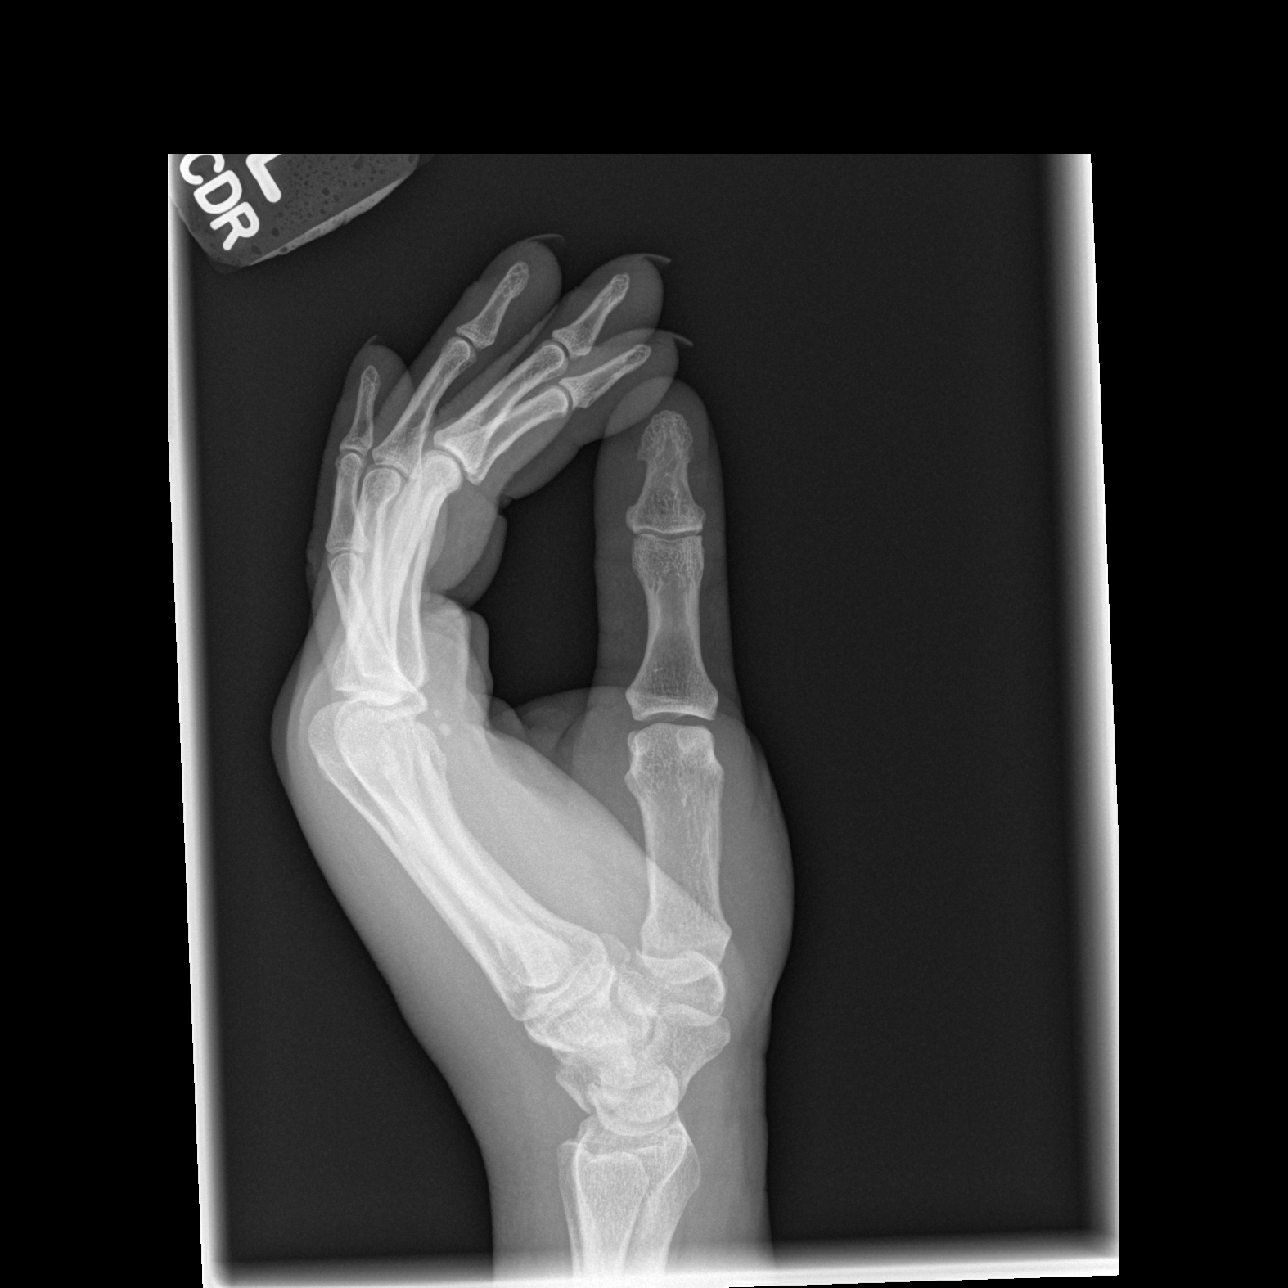

[3 of 3 positions shown; findings below may reference images not displayed]

FINDINGS: Swelling with no underlying bony abnormality. No fracture or bony
erosion.
IMPRESSION: Soft tissue swelling of uncertain etiology. No bony abnormality
noted.
